# Patient Record
Sex: Male | Born: 1980 | Race: White | Hispanic: No | Marital: Married | Smoking: Former smoker
Health system: Southern US, Community
[De-identification: ages and names within clinical notes are randomized; demographics above are authoritative.]

## PROBLEM LIST (undated history)

## (undated) DIAGNOSIS — R1031 Right lower quadrant pain: Secondary | ICD-10-CM

## (undated) DIAGNOSIS — R630 Anorexia: Secondary | ICD-10-CM

## (undated) DIAGNOSIS — J069 Acute upper respiratory infection, unspecified: Secondary | ICD-10-CM

## (undated) HISTORY — DX: Anorexia: R63.0

## (undated) HISTORY — PX: HERNIA REPAIR: SHX51

## (undated) HISTORY — PX: OTHER SURGICAL HISTORY: SHX169

## (undated) HISTORY — DX: Acute upper respiratory infection, unspecified: J06.9

## (undated) HISTORY — PX: APPENDECTOMY: SHX54

## (undated) HISTORY — DX: Right lower quadrant pain: R10.31

---

## 2010-09-21 ENCOUNTER — Encounter: Payer: Self-pay | Admitting: Physician Assistant

## 2013-03-07 ENCOUNTER — Encounter: Payer: Self-pay | Admitting: Family Medicine

## 2013-03-07 ENCOUNTER — Encounter (INDEPENDENT_AMBULATORY_CARE_PROVIDER_SITE_OTHER): Payer: Self-pay

## 2013-03-07 ENCOUNTER — Ambulatory Visit (INDEPENDENT_AMBULATORY_CARE_PROVIDER_SITE_OTHER): Payer: Managed Care, Other (non HMO) | Admitting: Family Medicine

## 2013-03-07 VITALS — BP 129/86 | HR 56 | Temp 97.8°F | Ht 71.0 in | Wt 173.2 lb

## 2013-03-07 DIAGNOSIS — J020 Streptococcal pharyngitis: Secondary | ICD-10-CM

## 2013-03-07 LAB — POCT RAPID STREP A (OFFICE): Rapid Strep A Screen: NEGATIVE

## 2013-03-07 MED ORDER — AMOXICILLIN 875 MG PO TABS: 875.0000 mg | ORAL_TABLET | Freq: Two times a day (BID) | ORAL | Status: DC

## 2013-03-07 NOTE — Patient Instructions (Signed)

## 2013-03-07 NOTE — Progress Notes (Signed)
  Subjective:    Patient ID: Andre Long, male    DOB: 05-08-1980, 32 y.o.   MRN: [id number]  HPI  This 32 y.o. male presents for evaluation of URI sx's for over a week.  Review of Systems    No chest pain, SOB, HA, dizziness, vision change, N/V, diarrhea, constipation, dysuria, urinary urgency or frequency, myalgias, arthralgias or rash.  Objective:   Physical Exam  Vital signs noted  Well developed well nourished male.  HEENT - Head atraumatic Normocephalic                Eyes - PERRLA, Conjuctiva - clear Sclera- Clear EOMI                Ears - EAC's Wnl TM's Wnl Gross Hearing WNL                Nose - Nares patent                 Throat - oropharanx wnl Respiratory - Lungs CTA bilateral Cardiac - RRR S1 and S2 without murmur GI - Abdomen soft Nontender and bowel sounds active x 4 Extremities - No edema. Neuro - Grossly intact.  Results for orders placed in visit on 03/07/13  POCT RAPID STREP A (OFFICE)      Result Value Range   Rapid Strep A Screen Negative  Negative      Assessment & Plan:  Streptococcal sore throat - Plan: POCT rapid strep A, amoxicillin (AMOXIL) 875 MG tablet  Deatra Canter FNP

## 2013-07-25 ENCOUNTER — Ambulatory Visit (INDEPENDENT_AMBULATORY_CARE_PROVIDER_SITE_OTHER): Payer: Managed Care, Other (non HMO) | Admitting: Family Medicine

## 2013-07-25 ENCOUNTER — Encounter: Payer: Self-pay | Admitting: Family Medicine

## 2013-07-25 VITALS — BP 132/87 | HR 66 | Temp 98.3°F | Ht 71.0 in | Wt 175.0 lb

## 2013-07-25 DIAGNOSIS — R071 Chest pain on breathing: Secondary | ICD-10-CM

## 2013-07-25 DIAGNOSIS — R0789 Other chest pain: Secondary | ICD-10-CM

## 2013-07-25 MED ORDER — NAPROXEN 500 MG PO TABS: 500.0000 mg | ORAL_TABLET | Freq: Two times a day (BID) | ORAL | Status: DC

## 2013-07-25 MED ORDER — CYCLOBENZAPRINE HCL 10 MG PO TABS: 10.0000 mg | ORAL_TABLET | Freq: Three times a day (TID) | ORAL | Status: DC | PRN

## 2013-07-25 NOTE — Progress Notes (Signed)
   Subjective:    Patient ID: Francia GreavesJamie Hofman, male    DOB: 20-Nov-1980, 33 y.o.   MRN: [id number]  HPI This 33 y.o. male presents for evaluation of right shoulder and chest wall discomfort after cutting Down some brush a few days ago.   Review of Systems No chest pain, SOB, HA, dizziness, vision change, N/V, diarrhea, constipation, dysuria, urinary urgency or frequency, myalgias, arthralgias or rash.     Objective:   Physical Exam  Vital signs noted  Well developed well nourished male.  HEENT - Head atraumatic Normocephalic                Eyes - PERRLA, Conjuctiva - clear Sclera- Clear EOMI Respiratory - Lungs CTA bilateral Cardiac - RRR S1 and S2 without murmur MS - TTP right lateral intercostal and below pectoralis muscle.  No paradoxical movement or          Crepitus.      Assessment & Plan:  Chest wall pain - Plan: cyclobenzaprine (FLEXERIL) 10 MG tablet, naproxen (NAPROSYN) 500 MG tablet Note for work today and follow up prn  Deatra CanterWilliam J Oxford FNP

## 2013-10-07 ENCOUNTER — Ambulatory Visit (INDEPENDENT_AMBULATORY_CARE_PROVIDER_SITE_OTHER): Payer: Managed Care, Other (non HMO) | Admitting: Family Medicine

## 2013-10-07 ENCOUNTER — Ambulatory Visit (INDEPENDENT_AMBULATORY_CARE_PROVIDER_SITE_OTHER): Payer: Managed Care, Other (non HMO)

## 2013-10-07 ENCOUNTER — Encounter: Payer: Self-pay | Admitting: Family Medicine

## 2013-10-07 VITALS — BP 134/86 | HR 72 | Temp 98.8°F | Ht 71.0 in | Wt 175.4 lb

## 2013-10-07 DIAGNOSIS — M654 Radial styloid tenosynovitis [de Quervain]: Secondary | ICD-10-CM

## 2013-10-07 DIAGNOSIS — M79609 Pain in unspecified limb: Secondary | ICD-10-CM

## 2013-10-07 DIAGNOSIS — R071 Chest pain on breathing: Secondary | ICD-10-CM

## 2013-10-07 DIAGNOSIS — R0789 Other chest pain: Secondary | ICD-10-CM

## 2013-10-07 MED ORDER — HYDROCODONE-ACETAMINOPHEN 5-325 MG PO TABS: 1.0000 | ORAL_TABLET | Freq: Four times a day (QID) | ORAL | Status: DC | PRN

## 2013-10-07 MED ORDER — NAPROXEN 500 MG PO TABS: 500.0000 mg | ORAL_TABLET | Freq: Two times a day (BID) | ORAL | Status: DC

## 2013-10-07 NOTE — Progress Notes (Signed)
   Subjective:    Patient ID: Andre Long, male    DOB: Feb 01, 1981, 33 y.o.   MRN: [id number]  HPI This 33 y.o. male presents for evaluation of left thumb and wrist pain for several days. He denies any injury.  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  He has been Having a lot of pain and wrist discomfort on the left thumb and wrist.   Review of Systems C/o left thumb and wrist pain No chest pain, SOB, HA, dizziness, vision change, N/V, diarrhea, constipation, dysuria, urinary urgency or frequency, myalgias, arthralgias or rash.     Objective:   Physical Exam Vital signs noted  Well developed well nourished male.  HEENT - Head atraumatic Normocephalic Respiratory - Lungs CTA bilateral Cardiac - RRR S1 and S2 without murmur GI - Abdomen soft Nontender and bowel sounds active x 4 MS - Positive left finklestein's   Xray left wrist and thumb - negative for fracture      Assessment & Plan:   Tendinitis, de Quervain's - Plan: naproxen (NAPROSYN) 500 MG tablet, HYDROcodone-acetaminophen (NORCO) 5-325 MG per tablet, DG Finger Thumb Left, DG Wrist Complete Left  Spica splint left wrist  Deatra CanterWilliam J Oxford FNP

## 2013-11-07 ENCOUNTER — Encounter: Payer: Self-pay | Admitting: Family Medicine

## 2013-11-07 ENCOUNTER — Ambulatory Visit (INDEPENDENT_AMBULATORY_CARE_PROVIDER_SITE_OTHER): Payer: Managed Care, Other (non HMO) | Admitting: Family Medicine

## 2013-11-07 VITALS — BP 120/81 | HR 65 | Temp 98.1°F | Ht 71.0 in | Wt 173.2 lb

## 2013-11-07 DIAGNOSIS — M654 Radial styloid tenosynovitis [de Quervain]: Secondary | ICD-10-CM

## 2013-11-07 MED ORDER — HYDROCODONE-ACETAMINOPHEN 5-325 MG PO TABS: 1.0000 | ORAL_TABLET | Freq: Four times a day (QID) | ORAL | Status: DC | PRN

## 2013-11-07 MED ORDER — NAPROXEN 500 MG PO TABS: 500.0000 mg | ORAL_TABLET | Freq: Two times a day (BID) | ORAL | Status: DC

## 2013-11-07 NOTE — Progress Notes (Signed)
   Subjective:    Patient ID: Andre Long, male    DOB: Jun 05, 1980, 33 y.o.   MRN: [id number]  HPI  This 33 y.o. male presents for evaluation of right wrist and thumb discomfort.  He has been wearing a spica splint but is not getting better.  He takes naprosyn and hydrocodone and is out of meds.  Review of Systems C/o right wrist pain. No chest pain, SOB, HA, dizziness, vision change, N/V, diarrhea, constipation, dysuria, urinary urgency or frequency, myalgias, arthralgias or rash.     Objective:   Physical Exam Vital signs noted  Well developed well nourished male.  HEENT - Head atraumatic Normocephalic                Eyes - PERRLA, Conjuctiva - clear Sclera- Clear EOMI                Ears - EAC's Wnl TM's Wnl Gross Hearing WNL                Throat - oropharanx wnl Respiratory - Lungs CTA bilateral Cardiac - RRR S1 and S2 without murmur MS - TTP right wrist and positive finklestein and crepitus right thumb       Assessment & Plan:  Tendinitis, de Quervain's - Plan: HYDROcodone-acetaminophen (NORCO) 5-325 MG per tablet, naproxen (NAPROSYN) 500 MG tablet, Ambulatory referral to Orthopedic Surgery  Refer to ortho since not better.  Deatra CanterWilliam J Oxford FNP

## 2014-12-24 ENCOUNTER — Ambulatory Visit (INDEPENDENT_AMBULATORY_CARE_PROVIDER_SITE_OTHER): Payer: Managed Care, Other (non HMO) | Admitting: Family Medicine

## 2014-12-24 ENCOUNTER — Encounter: Payer: Self-pay | Admitting: Family Medicine

## 2014-12-24 VITALS — BP 126/87 | HR 93 | Temp 98.1°F | Ht 71.0 in | Wt 184.0 lb

## 2014-12-24 DIAGNOSIS — G5602 Carpal tunnel syndrome, left upper limb: Secondary | ICD-10-CM | POA: Diagnosis not present

## 2014-12-24 DIAGNOSIS — G5600 Carpal tunnel syndrome, unspecified upper limb: Secondary | ICD-10-CM | POA: Insufficient documentation

## 2014-12-24 DIAGNOSIS — G5603 Carpal tunnel syndrome, bilateral upper limbs: Secondary | ICD-10-CM

## 2014-12-24 DIAGNOSIS — G5601 Carpal tunnel syndrome, right upper limb: Secondary | ICD-10-CM | POA: Diagnosis not present

## 2014-12-24 MED ORDER — MELOXICAM 7.5 MG PO TABS: 7.5000 mg | ORAL_TABLET | Freq: Every day | ORAL | Status: DC

## 2014-12-24 NOTE — Progress Notes (Signed)
BP 126/87 mmHg  Pulse 93  Temp(Src) 98.1 F (36.7 C) (Oral)  Ht  (1.803 m)  Wt 184 lb (83.462 kg)  BMI 25.67 kg/m2   Subjective:    Patient ID: Andre Long, male    DOB: 12-Dec-1980, 34 y.o.   MRN: [id number]  HPI: Andre Long is a 34 y.o. male presenting on 12/24/2014 for Numbness and Hand Pain   HPI Hand pain Patient has bilateral hand pain that is also described as a sharp tingling feeling likely new leg falls asleep. It is been happening more frequently over the past few months. The pain is more on the medial side of his hand and fingers and his thumb. It is worse on his right hand than his left because he is right-handed. Patient denies any fevers or chills or warmth or redness in the area. 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 He has a wrist brace for the right hand and will obtain one for the left hand as well.  Relevant past medical, surgical, family and social history reviewed and updated as indicated. Interim medical history since our last visit reviewed. Allergies and medications reviewed and updated.  Review of Systems  Constitutional: Negative for fever and chills.  HENT: Negative for ear discharge and ear pain.   Eyes: Negative for discharge and visual disturbance.  Respiratory: Negative for shortness of breath and wheezing.   Cardiovascular: Negative for chest pain and leg swelling.  Gastrointestinal: Negative for abdominal pain, diarrhea and constipation.  Genitourinary: Negative for difficulty urinating.  Musculoskeletal: Negative for back pain, joint swelling, arthralgias, gait problem, neck pain and neck stiffness.  Skin: Negative for rash.  Neurological: Positive for numbness (and tingling). Negative for syncope, light-headedness and headaches.  All other systems reviewed and are negative.   Per HPI unless specifically indicated above     Medication List       This list is accurate as of: 12/24/14  4:16 PM.  Always  use your most recent med list.               loratadine 10 MG tablet  Commonly known as:  CLARITIN  Take 10 mg by mouth daily.     meloxicam 7.5 MG tablet  Commonly known as:  MOBIC  Take 1 tablet (7.5 mg total) by mouth daily.           Objective:    BP 126/87 mmHg  Pulse 93  Temp(Src) 98.1 F (36.7 C) (Oral)  Ht  (1.803 m)  Wt 184 lb (83.462 kg)  BMI 25.67 kg/m2  Wt Readings from Last 3 Encounters:  12/24/14 184 lb (83.462 kg)  11/07/13 173 lb 3.2 oz (78.563 kg)  10/07/13 175 lb 6.4 oz (79.561 kg)    Physical Exam  Constitutional: He is oriented to person, place, and time. He appears well-developed and well-nourished. No distress.  Eyes: Conjunctivae and EOM are normal. Pupils are equal, round, and reactive to light. Right eye exhibits no discharge. No scleral icterus.  Cardiovascular: Normal rate, regular rhythm, normal heart sounds and intact distal pulses.   No murmur heard. Pulmonary/Chest: Effort normal and breath sounds normal. No respiratory distress. He has no wheezes.  Abdominal: He exhibits no distension.  Musculoskeletal: Normal range of motion. He exhibits no edema.       Right wrist: He exhibits tenderness (positive Tinel). He exhibits normal range of motion, no bony tenderness, no swelling, no effusion, no crepitus, no  deformity and no laceration.       Left wrist: He exhibits normal range of motion, no tenderness, no bony tenderness, no swelling, no effusion, no crepitus, no deformity and no laceration.       Arms: Neurological: He is alert and oriented to person, place, and time. Coordination normal.  Skin: Skin is warm and dry. No rash noted. He is not diaphoretic.  Psychiatric: He has a normal mood and affect. His behavior is normal.  Vitals reviewed.   Results for orders placed or performed in visit on 03/07/13  POCT rapid strep A  Result Value Ref Range   Rapid Strep A Screen Negative Negative      Assessment & Plan:   Problem List  Items Addressed This Visit      Nervous and Auditory   Carpal tunnel syndrome - Primary    Bilateral nerve pain, worse right than left, will try anti-inflammatory. If not improved with anti-inflammatory will try injections of carpal tunnel region with steroids. If not improved with that then we'll do referral to surgeon      Relevant Medications   meloxicam (MOBIC) 7.5 MG tablet       Follow up plan: Return if symptoms worsen or fail to improve.  Arville Care, MD [facility] 12/24/2014, 4:16 PM

## 2014-12-24 NOTE — Patient Instructions (Signed)
Carpal Tunnel Syndrome The carpal tunnel is a narrow area located on the palm side of your wrist. The tunnel is formed by the wrist bones and ligaments. Nerves, blood vessels, and tendons pass through the carpal tunnel. Repeated wrist motion or certain diseases may cause swelling within the tunnel. This swelling pinches the main nerve in the wrist (median nerve) and causes the painful hand and arm condition called carpal tunnel syndrome. CAUSES   Repeated wrist motions.  Wrist injuries.  Certain diseases like arthritis, diabetes, alcoholism, hyperthyroidism, and kidney failure.  Obesity.  Pregnancy. SYMPTOMS   A "pins and needles" feeling in your fingers or hand, especially in your thumb, index and middle fingers.  Tingling or numbness in your fingers or hand.  An aching feeling in your entire arm, especially when your wrist and elbow are bent for long periods of time.  Wrist pain that goes up your arm to your shoulder.  Pain that goes down into your palm or fingers.  A weak feeling in your hands. DIAGNOSIS  Your health care provider will take your history and perform a physical exam. An electromyography test may be needed. This test measures electrical signals sent out by your nerves into the muscles. The electrical signals are usually slowed by carpal tunnel syndrome. You may also need X-rays. TREATMENT  Carpal tunnel syndrome may clear up by itself. Your health care provider may recommend a wrist splint or medicine such as a nonsteroidal anti-inflammatory medicine. Cortisone injections may help. Sometimes, surgery may be needed to free the pinched nerve.  HOME CARE INSTRUCTIONS   Take all medicine as directed by your health care provider. Only take over-the-counter or prescription medicines for pain, discomfort, or fever as directed by your health care provider.  If you were given a splint to keep your wrist from bending, wear it as directed. It is important to wear the splint at  night. Wear the splint for as long as you have pain or numbness in your hand, arm, or wrist. This may take 1 to 2 months.  Rest your wrist from any activity that may be causing your pain. If your symptoms are work-related, you may need to talk to your employer about changing to a job that does not require using your wrist.  Put ice on your wrist after long periods of wrist activity.  Put ice in a plastic bag.  Place a towel between your skin and the bag.  Leave the ice on for 15-20 minutes, 03-04 times a day.  Keep all follow-up visits as directed by your health care provider. This includes any orthopedic referrals, physical therapy, and rehabilitation. Any delay in getting necessary care could result in a delay or failure of your condition to heal. SEEK IMMEDIATE MEDICAL CARE IF:   You have new, unexplained symptoms.  Your symptoms get worse and are not helped or controlled with medicines. MAKE SURE YOU:   Understand these instructions.  Will watch your condition.  Will get help right away if you are not doing well or get worse. Document Released: 04/01/2000 Document Revised: 08/19/2013 Document Reviewed: 02/18/2011 Exit[employer] Patient Information 2015 [employer]. This information is not intended to replace advice given to you by your health care provider. Make sure you discuss any questions you have with your health care provider.  

## 2014-12-24 NOTE — Assessment & Plan Note (Addendum)
Bilateral nerve pain, worse right than left, will try anti-inflammatory. If not improved with anti-inflammatory will try injections of carpal tunnel region with steroids. If not improved with that then we'll do referral to surgeon

## 2015-01-20 ENCOUNTER — Other Ambulatory Visit: Payer: Self-pay | Admitting: Family Medicine

## 2015-03-04 ENCOUNTER — Other Ambulatory Visit: Payer: Self-pay | Admitting: Family Medicine

## 2015-06-16 ENCOUNTER — Ambulatory Visit (INDEPENDENT_AMBULATORY_CARE_PROVIDER_SITE_OTHER): Payer: Managed Care, Other (non HMO) | Admitting: Family Medicine

## 2015-06-16 ENCOUNTER — Encounter: Payer: Self-pay | Admitting: *Deleted

## 2015-06-16 ENCOUNTER — Ambulatory Visit (INDEPENDENT_AMBULATORY_CARE_PROVIDER_SITE_OTHER): Payer: Managed Care, Other (non HMO)

## 2015-06-16 ENCOUNTER — Encounter: Payer: Self-pay | Admitting: Family Medicine

## 2015-06-16 VITALS — BP 131/88 | HR 76 | Temp 97.4°F | Ht 71.0 in | Wt 188.0 lb

## 2015-06-16 DIAGNOSIS — M79645 Pain in left finger(s): Secondary | ICD-10-CM

## 2015-06-16 MED ORDER — MELOXICAM 15 MG PO TABS: 15.0000 mg | ORAL_TABLET | Freq: Every day | ORAL | Status: DC

## 2015-06-16 NOTE — Patient Instructions (Addendum)
Use splint as much as possible during the day and even at night if possible Take anti-inflammatory medicine regularly after eating unless it bothers his stomach then discontinue it Please call us in 2 weeks and we will schedule a visit with orthopedic surgeon for follow-up if you're no better by that time.

## 2015-06-16 NOTE — Progress Notes (Signed)
Subjective:    Patient ID: Andre Long, male    DOB: Oct 30, 1980, 35 y.o.   MRN: [id number]  HPI Patient here today for left thumb pain and immobilization. This first was noticed about 3 months ago. The patient has a history of having had tendon surgery on the left wrist about one year ago. Over the past 3 months he is developing more problems with his thumb and being able to use the DIP joint of the left thumb. It is painful with use but not painful to touch. His work requires the use of both hands. He is not able to use this as he would like to at work.      Patient Active Problem List   Diagnosis Date Noted  . Carpal tunnel syndrome 12/24/2014   Outpatient Encounter Prescriptions as of 06/16/2015  Medication Sig  . loratadine (CLARITIN) 10 MG tablet Take 10 mg by mouth daily.  . [DISCONTINUED] meloxicam (MOBIC) 7.5 MG tablet TAKE 1 TABLET (7.5 MG TOTAL) BY MOUTH DAILY.   No facility-administered encounter medications on file as of 06/16/2015.     Review of Systems  Constitutional: Negative.   HENT: Negative.   Eyes: Negative.   Respiratory: Negative.   Cardiovascular: Negative.   Gastrointestinal: Negative.   Endocrine: Negative.   Genitourinary: Negative.   Musculoskeletal: Positive for arthralgias (left thumb).  Skin: Negative.   Allergic/Immunologic: Negative.   Neurological: Negative.   Hematological: Negative.   Psychiatric/Behavioral: Negative.        Objective:   Physical Exam  Constitutional: He is oriented to person, place, and time. He appears well-developed and well-nourished. No distress.  Musculoskeletal: He exhibits no edema or tenderness.  Limited range of motion of DIP joint of the left thumb. No swelling no tenderness to palpation.  Neurological: He is alert and oriented to person, place, and time.  Skin: Skin is warm and dry. No rash noted.  Psychiatric: He has a normal mood and affect. His behavior is normal. Judgment and thought content normal.    Nursing note and vitals reviewed.  BP 131/88 mmHg  Pulse 76  Temp(Src) 97.4 F (36.3 C) (Oral)  Ht  (1.803 m)  Wt 188 lb (85.276 kg)  BMI 26.23 kg/m2  [facility] reading (PRIMARY) by  Dr.Zinedine Ellner-left thumb                                  We will fix a splint for him to use on the thumb to wear during the day at work and at night when sleeping.      Assessment & Plan:  1. Thumb pain, left -Wear splint as directed during the day at work and at night while sleeping -Call in 2 weeks and if not better we will arrange for you to see the orthopedic surgeon again, Dr. Orlan Leavens. - meloxicam (MOBIC) 15 MG tablet; Take 1 tablet (15 mg total) by mouth daily.  Dispense: 30 tablet; Refill: 0 - DG Finger Thumb Left; Future  Meds ordered this encounter  Medications  . meloxicam (MOBIC) 15 MG tablet    Sig: Take 1 tablet (15 mg total) by mouth daily.    Dispense:  30 tablet    Refill:  0   Patient Instructions  Use splint as much as possible during the day and even at night if possible Take anti-inflammatory medicine regularly after eating unleDmarcus Deciccothers his stomach then discontinue it Please  call us in 2 weeks and we will schedule a visit with orthopedic surgeon for follow-up if you're no better by that time.   Nyra Capes MD

## 2015-06-30 ENCOUNTER — Encounter: Payer: Self-pay | Admitting: *Deleted

## 2015-06-30 ENCOUNTER — Ambulatory Visit: Payer: Managed Care, Other (non HMO) | Admitting: Family Medicine

## 2015-06-30 NOTE — Progress Notes (Signed)
Pt came by today to let [facility] know how his thumb pain was. He states that the mobic helps some, but they pain is still there. [facility] wanted to go ahead and send pt to Hand specialist. [facility] called and got the pt an appt on Tuesday with Dr Orlan Leavensrtman. He is to continue Mobic and follow up with this appt.

## 2015-07-15 ENCOUNTER — Other Ambulatory Visit: Payer: Self-pay | Admitting: Family Medicine

## 2015-09-07 ENCOUNTER — Encounter: Payer: Self-pay | Admitting: Family

## 2015-09-07 ENCOUNTER — Ambulatory Visit (INDEPENDENT_AMBULATORY_CARE_PROVIDER_SITE_OTHER): Payer: Managed Care, Other (non HMO) | Admitting: Family

## 2015-09-07 ENCOUNTER — Encounter (INDEPENDENT_AMBULATORY_CARE_PROVIDER_SITE_OTHER): Payer: Self-pay

## 2015-09-07 VITALS — BP 132/90 | HR 105 | Temp 97.9°F | Ht 71.0 in | Wt 184.6 lb

## 2015-09-07 DIAGNOSIS — J069 Acute upper respiratory infection, unspecified: Secondary | ICD-10-CM | POA: Diagnosis not present

## 2015-09-07 MED ORDER — FLUTICASONE PROPIONATE 50 MCG/ACT NA SUSP: 2.0000 | Freq: Every day | NASAL | Status: DC

## 2015-09-07 MED ORDER — AZITHROMYCIN 250 MG PO TABS: ORAL_TABLET | ORAL | Status: DC

## 2015-09-07 NOTE — Progress Notes (Signed)
Subjective:    Patient ID: Andre Long, male    DOB: [birth year], 35 y.o.   MRN: [id number]  Cough This is a new problem. The current episode started in the past 7 days. The problem has been gradually worsening. The problem occurs every few minutes. The cough is non-productive. Associated symptoms include chills, headaches, myalgias, nasal congestion, postnasal drip, rhinorrhea and a sore throat. Pertinent negatives include no ear congestion, ear pain, fever, shortness of breath or wheezing. The symptoms are aggravated by lying down. He has tried rest (mucinex) for the symptoms. The treatment provided mild relief. There is no history of asthma or COPD.  Sinus Problem Associated symptoms include chills, coughing, headaches and a sore throat. Pertinent negatives include no ear pain or shortness of breath.      Review of Systems  Constitutional: Positive for chills. Negative for fever.  HENT: Positive for postnasal drip, rhinorrhea and sore throat. Negative for ear pain.   Respiratory: Positive for cough. Negative for shortness of breath and wheezing.   Musculoskeletal: Positive for myalgias.  Neurological: Positive for headaches.  All other systems reviewed and are negative.      Objective:   Physical Exam  Constitutional: He is oriented to person, place, and time. He appears well-developed and well-nourished. No distress.  HENT:  Head: Normocephalic.  Right Ear: External ear normal. Tympanic membrane is erythematous.  Left Ear: External ear normal. Tympanic membrane is erythematous.  Nasal passage erythemas with mild swelling  Oropharynx erythemas   Eyes: Pupils are equal, round, and reactive to light. Right eye exhibits no discharge. Left eye exhibits no discharge.  Neck: Normal range of motion. Neck supple. No thyromegaly present.  Cardiovascular: Normal rate, regular rhythm, normal heart sounds and intact distal pulses.   No murmur heard. Pulmonary/Chest: Effort normal and  breath sounds normal. No respiratory distress. He has no wheezes.  Abdominal: Soft. Bowel sounds are normal. He exhibits no distension. There is no tenderness.  Musculoskeletal: Normal range of motion. He exhibits no edema or tenderness.  Neurological: He is alert and oriented to person, place, and time. He has normal reflexes. No cranial nerve deficit.  Skin: Skin is warm and dry. No rash noted. No erythema.  Psychiatric: He has a normal mood and affect. His behavior is normal. Judgment and thought content normal.  Vitals reviewed.     BP 132/90 mmHg  Pulse 105  Temp(Src) 97.9 F (36.6 C) (Oral)  Ht  (1.803 m)  Wt 184 lb 9.6 oz (83.734 kg)  BMI 25.76 kg/m2     Assessment & Plan:  1. Acute upper respiratory infection -- Take meds as prescribed - Use a cool mist humidifier  -Use saline nose sprays frequently -Saline irrigations of the nose can be very helpful if done frequently.  * 4X daily for 1 week*  * Use of a nettie pot can be helpful with this. Follow directions with this* -Force fluids -For any cough or congestion  Use plain Mucinex- regular strength or max strength is fine   * Children- consult with Pharmacist for dosing -For fever or aces or pains- take tylenol or ibuprofen appropriate for age and weight.  * for fevers greater than 101 orally you may alternate ibuprofen and tylenol every  3 hours. -Throat lozenges if help - azithromycin (ZITHROMAX Z-PAK) 250 MG tablet; As directed  Dispense: 1 each; Refill: 0 - fluticasone (FLONASE) 50 MCG/ACT nasal spray; Place 2 sprays into both nostrils daily.  Dispense: 16 g; Refill:  6  Jannifer Rodneyhristy Ashby Moskal, [place]

## 2015-09-07 NOTE — Patient Instructions (Addendum)
Upper Respiratory Infection, Adult Most upper respiratory infections (URIs) are a viral infection of the air passages leading to the lungs. A URI affects the nose, throat, and upper air passages. The most common type of URI is nasopharyngitis and is typically referred to as "the common cold." URIs run their course and usually go away on their own. Most of the time, a URI does not require medical attention, but sometimes a bacterial infection in the upper airways can follow a viral infection. This is called a secondary infection. Sinus and middle ear infections are common types of secondary upper respiratory infections. Bacterial pneumonia can also complicate a URI. A URI can worsen asthma and chronic obstructive pulmonary disease (COPD). Sometimes, these complications can require emergency medical care and may be life threatening.  CAUSES Almost all URIs are caused by viruses. A virus is a type of germ and can spread from one person to another.  RISKS FACTORS You may be at risk for a URI if:   You smoke.   You have chronic heart or lung disease.  You have a weakened defense (immune) system.   You are very young or very old.   You have nasal allergies or asthma.  You work in crowded or poorly ventilated areas.  You work in health care facilities or schools. SIGNS AND SYMPTOMS  Symptoms typically develop 2-3 days after you come in contact with a cold virus. Most viral URIs last 7-10 days. However, viral URIs from the influenza virus (flu virus) can last 14-18 days and are typically more severe. Symptoms may include:   Runny or stuffy (congested) nose.   Sneezing.   Cough.   Sore throat.   Headache.   Fatigue.   Fever.   Loss of appetite.   Pain in your forehead, behind your eyes, and over your cheekbones (sinus pain).  Muscle aches.  DIAGNOSIS  Your health care provider may diagnose a URI by:  Physical exam.  Tests to check that your symptoms are not due to  another condition such as:  Strep throat.  Sinusitis.  Pneumonia.  Asthma. TREATMENT  A URI goes away on its own with time. It cannot be cured with medicines, but medicines may be prescribed or recommended to relieve symptoms. Medicines may help:  Reduce your fever.  Reduce your cough.  Relieve nasal congestion. HOME CARE INSTRUCTIONS   Take medicines only as directed by your health care provider.   Gargle warm saltwater or take cough drops to comfort your throat as directed by your health care provider.  Use a warm mist humidifier or inhale steam from a shower to increase air moisture. This may make it easier to breathe.  Drink enough fluid to keep your urine clear or pale yellow.   Eat soups and other clear broths and maintain good nutrition.   Rest as needed.   Return to work when your temperature has returned to normal or as your health care provider advises. You may need to stay home longer to avoid infecting others. You can also use a face mask and careful hand washing to prevent spread of the virus.  Increase the usage of your inhaler if you have asthma.   Do not use any tobacco products, including cigarettes, chewing tobacco, or electronic cigarettes. If you need help quitting, ask your health care provider. PREVENTION  The best way to protect yourself from getting a cold is to practice good hygiene.   Avoid oral or hand contact with people with cold   symptoms.   Wash your hands often if contact occurs.  There is no clear evidence that vitamin C, vitamin E, echinacea, or exercise reduces the chance of developing a cold. However, it is always recommended to get plenty of rest, exercise, and practice good nutrition.  SEEK MEDICAL CARE IF:   You are getting worse rather than better.   Your symptoms are not controlled by medicine.   You have chills.  You have worsening shortness of breath.  You have brown or red mucus.  You have yellow or brown nasal  discharge.  You have pain in your face, especially when you bend forward.  You have a fever.  You have swollen neck glands.  You have pain while swallowing.  You have white areas in the back of your throat. SEEK IMMEDIATE MEDICAL CARE IF:   You have severe or persistent:  Headache.  Ear pain.  Sinus pain.  Chest pain.  You have chronic lung disease and any of the following:  Wheezing.  Prolonged cough.  Coughing up blood.  A change in your usual mucus.  You have a stiff neck.  You have changes in your:  Vision.  Hearing.  Thinking.  Mood. MAKE SURE YOU:   Understand these instructions.  Will watch your condition.  Will get help right away if you are not doing well or get worse.   This information is not intended to replace advice given to you by your health care provider. Make sure you discuss any questions you have with your health care provider.   Document Released: 09/28/2000 Document Revised: 08/19/2014 Document Reviewed: 07/10/2013 Elsevier Interactive Patient Education 2016 [employer]  - Take meds as prescribed - Use a cool mist humidifier  -Use saline nose sprays frequently -Saline irrigations of the nose can be very helpful if done frequently.  * 4X daily for 1 week*  * Use of a nettie pot can be helpful with this. Follow directions with this* -Force fluids -For any cough or congestion  Use plain Mucinex- regular strength or max strength is fine   * Children- consult with Pharmacist for dosing -For fever or aces or pains- take tylenol or ibuprofen appropriate for age and weight.  * for fevers greater than 101 orally you may alternate ibuprofen and tylenol every  3 hours. -Throat lozenges if help   Yesly Gerety, FNP   

## 2015-09-15 ENCOUNTER — Encounter: Payer: Self-pay | Admitting: Family Medicine

## 2015-09-15 ENCOUNTER — Ambulatory Visit (INDEPENDENT_AMBULATORY_CARE_PROVIDER_SITE_OTHER): Payer: Managed Care, Other (non HMO) | Admitting: Family Medicine

## 2015-09-15 VITALS — BP 131/85 | HR 79 | Temp 98.5°F | Ht 71.0 in | Wt 183.0 lb

## 2015-09-15 DIAGNOSIS — K625 Hemorrhage of anus and rectum: Secondary | ICD-10-CM

## 2015-09-15 MED ORDER — HYDROCORTISONE ACETATE 25 MG RE SUPP: 25.0000 mg | Freq: Two times a day (BID) | RECTAL | Status: DC

## 2015-09-15 NOTE — Progress Notes (Signed)
   Subjective:    Patient ID: Andre Long, male    DOB: Sep 04, 1980, 35 y.o.   MRN: [id number]  HPI Patient here today for bright red blood in stool.This problem has been going on for some time. Last week it got worse after he took Zithromax and developed some diarrhea. Has not had any pain or weight loss. There is no family history of colon cancer although he thinks his father may have had a polyp in the past.     Patient Active Problem List   Diagnosis Date Noted  . Carpal tunnel syndrome 12/24/2014   Outpatient Encounter Prescriptions as of 09/15/2015  Medication Sig  . fluticasone (FLONASE) 50 MCG/ACT nasal spray Place 2 sprays into both nostrils daily.  Marland Kitchen. loratadine (CLARITIN) 10 MG tablet Take 10 mg by mouth daily. OTC  [facility] comparable brand  . [DISCONTINUED] azithromycin (ZITHROMAX Z-PAK) 250 MG tablet As directed   No facility-administered encounter medications on file as of 09/15/2015.      Review of Systems  Constitutional: Negative.   HENT: Negative.   Eyes: Negative.   Respiratory: Negative.   Cardiovascular: Negative.   Gastrointestinal: Positive for blood in stool. Negative for rectal pain.  Endocrine: Negative.   Genitourinary: Negative.   Musculoskeletal: Negative.   Skin: Negative.   Allergic/Immunologic: Negative.   Neurological: Negative.   Hematological: Negative.   Psychiatric/Behavioral: Negative.        Objective:   Physical Exam  Genitourinary:  Rectal exam indicates internal hemorrhoids. There is no blood on the glove.   BP 131/85 mmHg  Pulse 79  Temp(Src) 98.5 F (36.9 C) (Oral)  Ht 5\' 11"  (1.803 m)  Wt 183 lb (83.008 kg)  BMI 25.53 kg/m2        Assessment & Plan:  1. Rectal bleeding I believe this is coming from internal hemorrhoids and have been formed patient. Will try Anusol HC suppositories for one week. If symptoms persist may need further evaluation with colonoscopy. Frederica KusterStephen M Miller MD

## 2015-09-15 NOTE — Addendum Note (Signed)
Addended by: Magdalene RiverBULLINS, Cledis H on: 09/15/2015 01:36 PM   Modules accepted: Orders

## 2016-02-26 IMAGING — CR DG WRIST COMPLETE 3+V*L*
3 series · 3 of 3 positions shown · non-contrast
Comparison: None.

CLINICAL DATA: Left wrist pain

EXAM:
LEFT WRIST - COMPLETE 3+ VIEW

[view not recorded (1 of 3)]
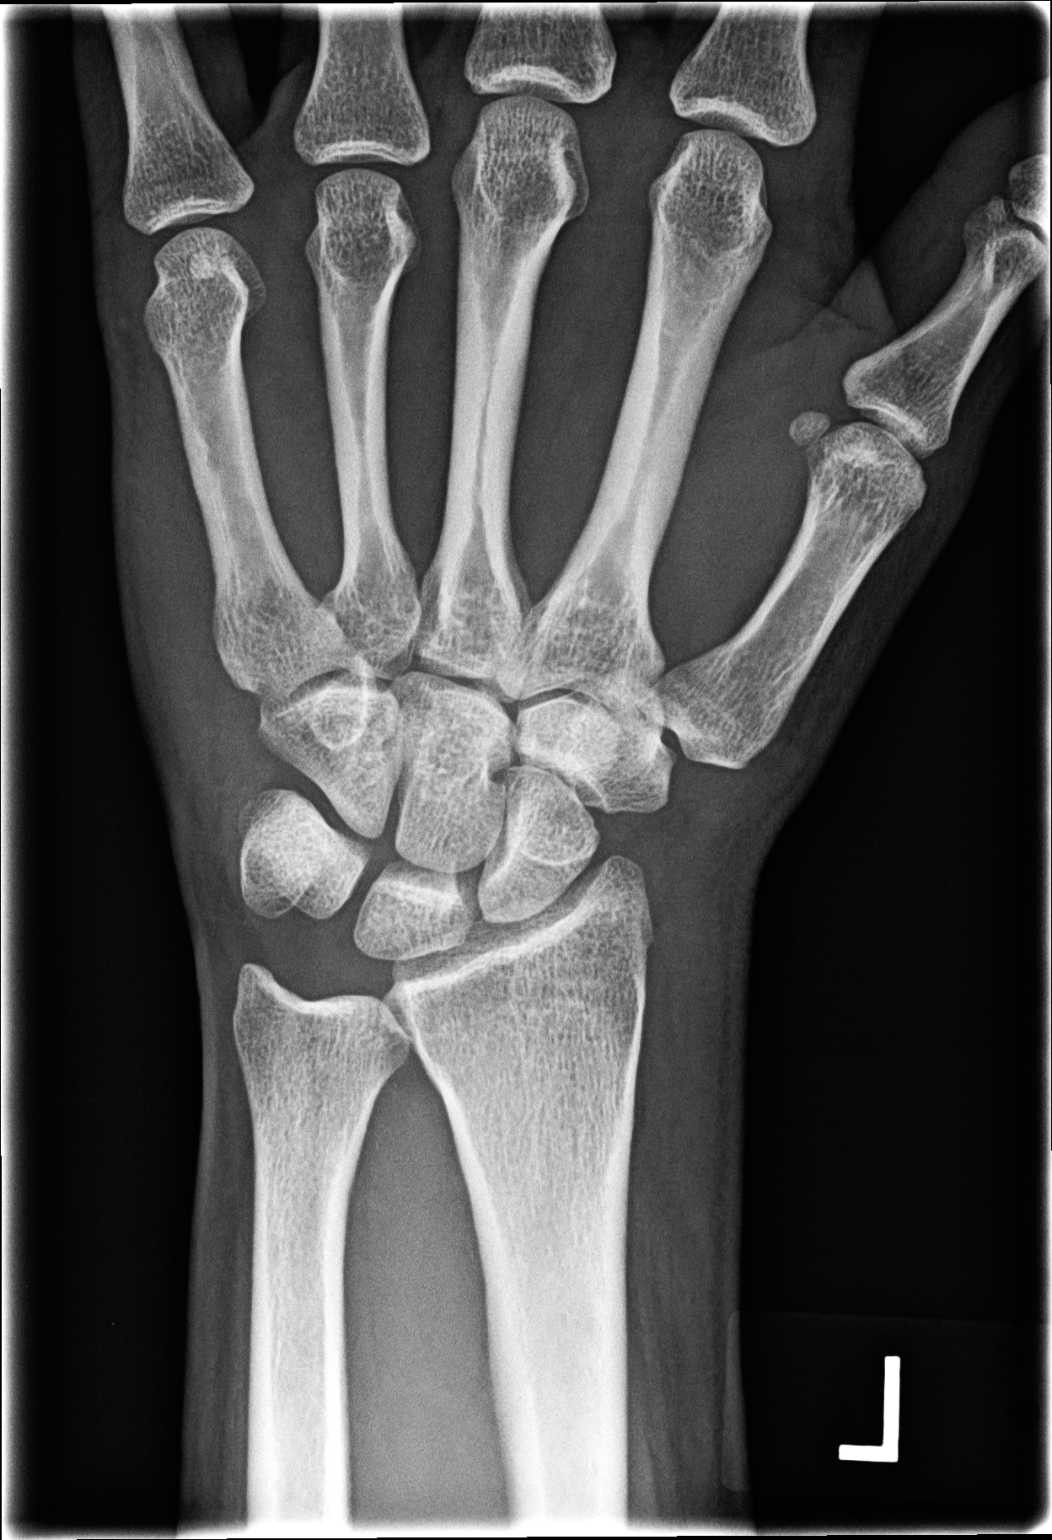

[view not recorded (2 of 3)]
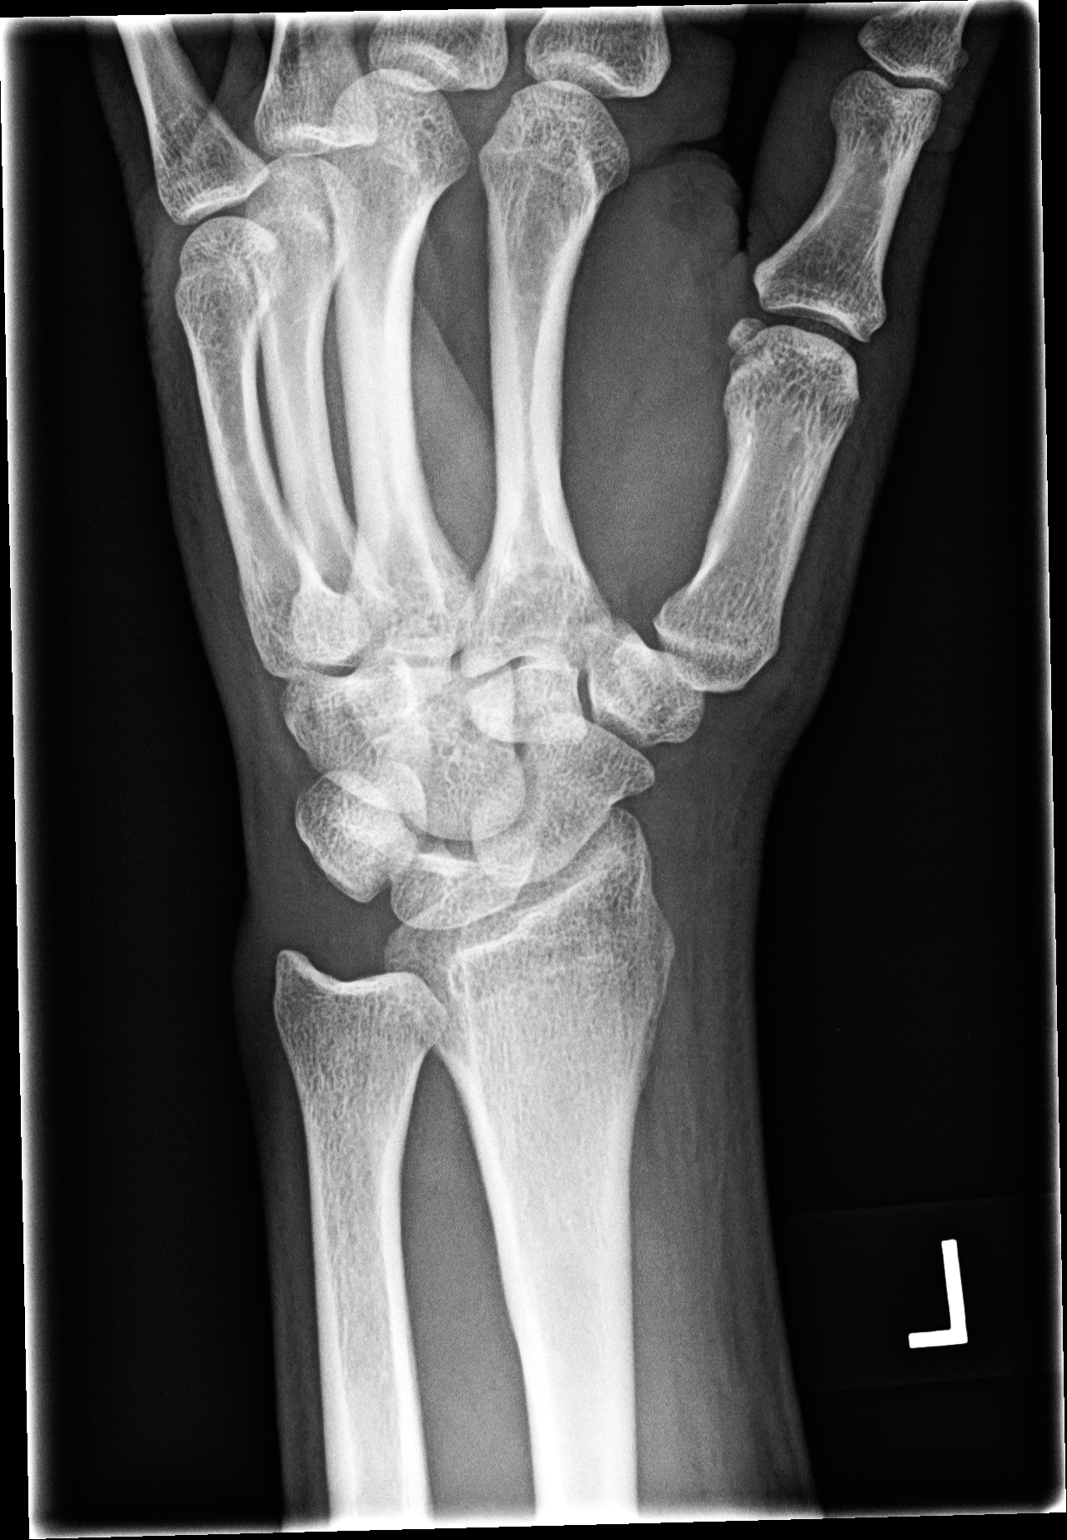

[view not recorded (3 of 3)]
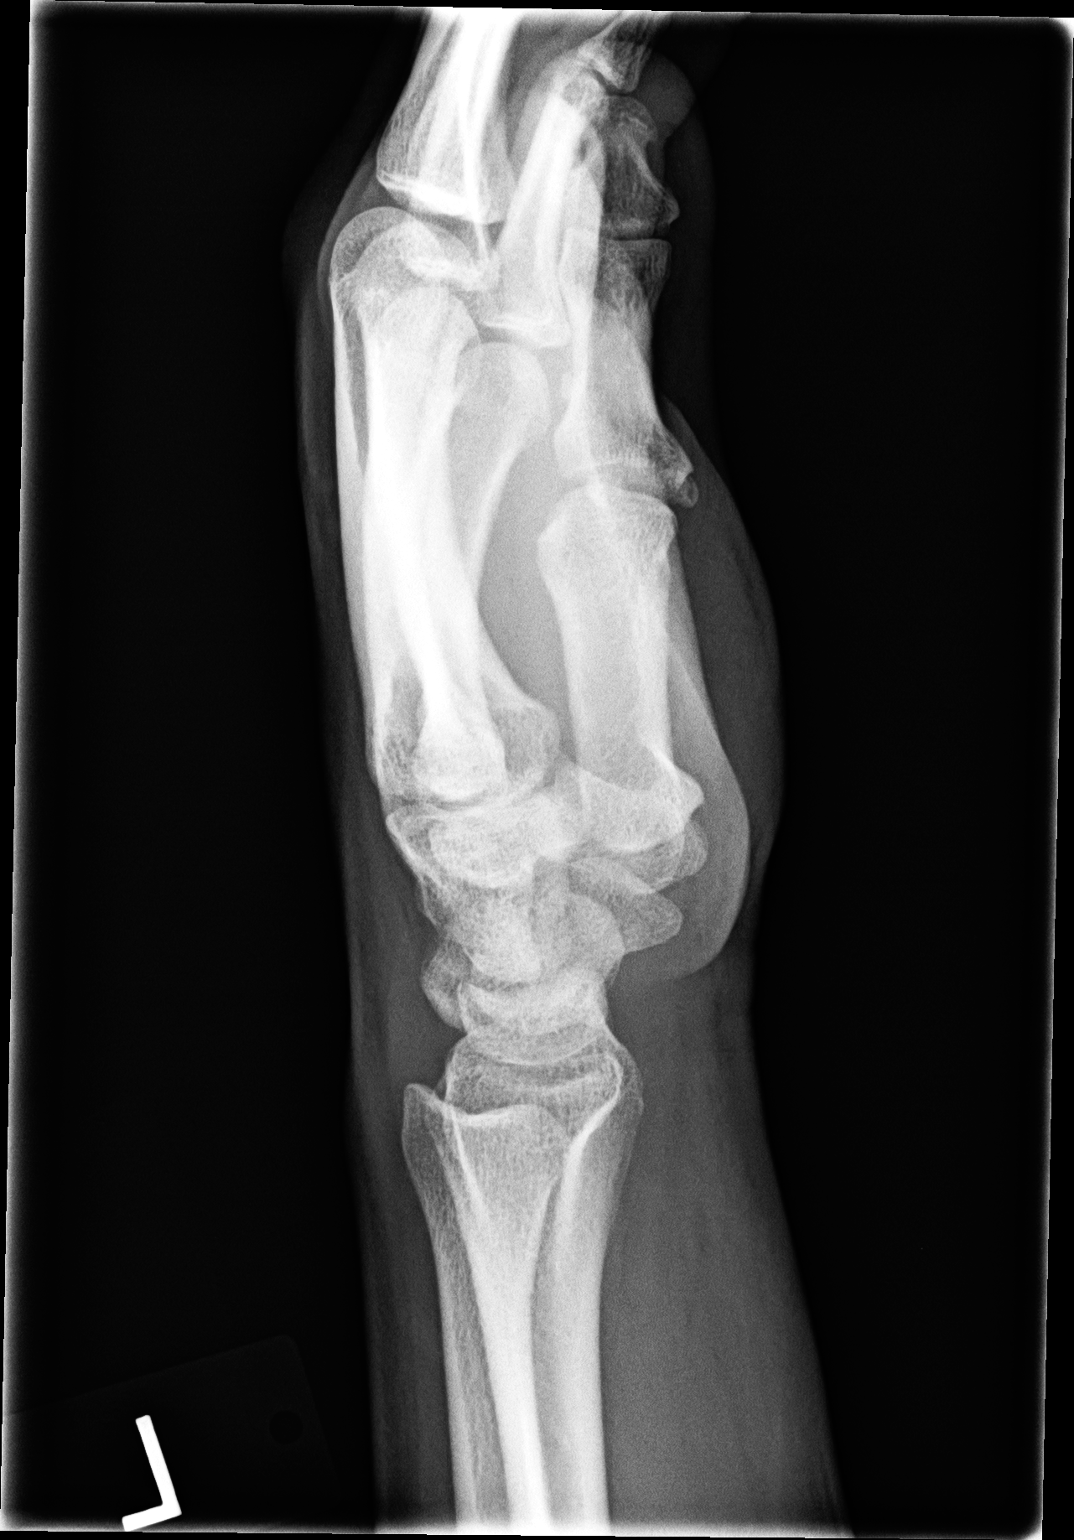

[3 of 3 positions shown; findings below may reference images not displayed]

FINDINGS: The radiocarpal joint space appears normal. The carpal bones are in
normal position. No erosion is seen. No acute abnormality is noted.
IMPRESSION: Negative.

## 2016-06-02 ENCOUNTER — Encounter: Payer: Self-pay | Admitting: Pediatrics

## 2016-06-02 ENCOUNTER — Ambulatory Visit (INDEPENDENT_AMBULATORY_CARE_PROVIDER_SITE_OTHER): Payer: Managed Care, Other (non HMO) | Admitting: Pediatrics

## 2016-06-02 VITALS — BP 145/93 | HR 73 | Temp 98.0°F | Ht 71.0 in | Wt 199.0 lb

## 2016-06-02 DIAGNOSIS — J302 Other seasonal allergic rhinitis: Secondary | ICD-10-CM

## 2016-06-02 DIAGNOSIS — J069 Acute upper respiratory infection, unspecified: Secondary | ICD-10-CM

## 2016-06-02 MED ORDER — AZELASTINE-FLUTICASONE 137-50 MCG/ACT NA SUSP: 2.0000 | Freq: Every day | NASAL | 1 refills | Status: DC

## 2016-06-02 NOTE — Progress Notes (Signed)
  Subjective:   Patient ID: Andre Long, male    DOB: 25-May-1980, 36 y.o.   MRN: [id number] CC: Sinusitis (pt here today c/o scratchy throat, cough, nasal congestion )  HPI: Andre Long is a 36 y.o. male presenting for Sinusitis (pt here today c/o scratchy throat, cough, nasal congestion )  Two nights ago started having scratchy throat, nasal congestion No fevers Good appetite, no vomiting Some upset stomach off and on Doesn't feel sick  Relevant past medical, surgical, family and social history reviewed. Allergies and medications reviewed and updated. History  Smoking Status  . Former Smoker  . Quit date: 03/07/2001  Smokeless Tobacco  . Never Used   ROS: Per HPI   Objective:    BP (!) 145/93   Pulse 73   Temp 98 F (36.7 C) (Oral)   Ht 5\' 11"  (1.803 m)   Wt 199 lb (90.3 kg)   BMI 27.75 kg/m   Wt Readings from Last 3 Encounters:  06/02/16 199 lb (90.3 kg)  09/15/15 183 lb (83 kg)  09/07/15 184 lb 9.6 oz (83.7 kg)    Gen: NAD, alert, cooperative with exam, NCAT EYES: EOMI, no conjunctival injection, or no icterus ENT:  TMs slightly red, nl LR, OP without erythema LYMPH: small < 1cm L ant cervical LAD CV: NRRR, normal S1/S2, no murmur, distal pulses 2+ b/l Resp: CTABL, no wheezes, normal WOB Ext: No edema, warm Neuro: Alert and oriented  Assessment & Plan:  Andre Long was seen today for sinus congestion for past 2 days. H/o allergies, bother her most over the spring to summer.  Diagnoses and all orders for this visit:  Seasonal allergic rhinitis, unspecified chronicity, unspecified trigger -     Azelastine-Fluticasone 137-50 MCG/ACT SUSP; Place 2 sprays into the nose daily.  Acute upper respiratory infection Discussed symptomatic care, return precautions No indications for antibiotics  Follow up plan: No Follow-up on file. Rex Krasarol Kristl Morioka, MD [facility]

## 2016-06-02 NOTE — Patient Instructions (Signed)
Netipot with distilled water 2-3 times a day to clear out sinuses Or Normal saline nasal spray Flonase steroid nasal spray Antihistamine daily such as cetirizine Lots of fluids  

## 2016-09-30 ENCOUNTER — Encounter: Payer: Self-pay | Admitting: Family Medicine

## 2016-09-30 ENCOUNTER — Ambulatory Visit (INDEPENDENT_AMBULATORY_CARE_PROVIDER_SITE_OTHER): Payer: Managed Care, Other (non HMO) | Admitting: Family Medicine

## 2016-09-30 VITALS — BP 129/82 | HR 73 | Temp 97.7°F | Ht 71.0 in | Wt 205.0 lb

## 2016-09-30 DIAGNOSIS — J069 Acute upper respiratory infection, unspecified: Secondary | ICD-10-CM

## 2016-09-30 DIAGNOSIS — S56811A Strain of other muscles, fascia and tendons at forearm level, right arm, initial encounter: Secondary | ICD-10-CM

## 2016-09-30 MED ORDER — CYCLOBENZAPRINE HCL 10 MG PO TABS: 10.0000 mg | ORAL_TABLET | Freq: Three times a day (TID) | ORAL | 0 refills | Status: DC | PRN

## 2016-09-30 MED ORDER — AZELASTINE-FLUTICASONE 137-50 MCG/ACT NA SUSP: 2.0000 | Freq: Every day | NASAL | 9 refills | Status: DC

## 2016-09-30 NOTE — Progress Notes (Signed)
BP 129/82   Pulse 73   Temp 97.7 F (36.5 C) (Oral)   Ht 5\' 11"  (1.803 m)   Wt 205 lb (93 kg)   BMI 28.59 kg/m    Subjective:    Patient ID: Andre Long, male    DOB: 1980/05/18, 36 y.o.   MRN: [id number]  HPI: Andre Long is a 36 y.o. male presenting on 09/30/2016 for Right arm pain (pain in lower arm, worse with use, ongoing for a while but worsened after bowling)   HPI Right forearm pain Patient has a on his forearm in the muscle body between the elbow and the wrist on the posterior side. he says this is been going on for at least a month or 2 but has been worse over the past couple days since he went bowling. He says that in his work he has a lot of gripping and physical labor that is likely what is causing this to flareup. He denies any grip strength loss or weakness but just has the pain on that side. He has been trying to use Advil which is helped some but not completely.  Patient needs a refill of her nasal spray for when her allergies flare up. She is not currently having any issues.  Relevant past medical, surgical, family and social history reviewed and updated as indicated. Interim medical history since our last visit reviewed. Allergies and medications reviewed and updated.  Review of Systems  Constitutional: Negative for chills and fever.  Eyes: Negative for discharge.  Respiratory: Negative for shortness of breath and wheezing.   Cardiovascular: Negative for chest pain and leg swelling.  Musculoskeletal: Positive for myalgias. Negative for back pain and gait problem.  Skin: Negative for rash.  All other systems reviewed and are negative.   Per HPI unless specifically indicated above        Objective:    BP 129/82   Pulse 73   Temp 97.7 F (36.5 C) (Oral)   Ht 5\' 11"  (1.803 m)   Wt 205 lb (93 kg)   BMI 28.59 kg/m   Wt Readings from Last 3 Encounters:  09/30/16 205 lb (93 kg)  06/02/16 199 lb (90.3 kg)  09/15/15 183 lb (83 kg)    Physical Exam    Constitutional: He is oriented to person, place, and time. He appears well-developed and well-nourished. No distress.  Eyes: Conjunctivae are normal. No scleral icterus.  Cardiovascular: Normal rate, regular rhythm, normal heart sounds and intact distal pulses.   No murmur heard. Pulmonary/Chest: Effort normal and breath sounds normal. No respiratory distress. He has no wheezes.  Musculoskeletal: Normal range of motion. He exhibits no edema.       Right forearm: He exhibits tenderness (Tenderness over the muscle body of the posterior forearm. Worse with pronation and supination, likely muscular strain versus tendinitis). He exhibits no bony tenderness, no swelling and no deformity.  Neurological: He is alert and oriented to person, place, and time. Coordination normal.  Skin: Skin is warm and dry. No rash noted. He is not diaphoretic.  Psychiatric: He has a normal mood and affect. His behavior is normal.  Nursing note and vitals reviewed.       Assessment & Plan:   Problem List Items Addressed This Visit    None    Visit Diagnoses    Strain of other muscles, fascia and tendons at forearm level, right arm, initial encounter    -  Primary   Relevant Medications   cyclobenzaprine (  FLEXERIL) 10 MG tablet   Acute upper respiratory infection       refill only of nasal spray   Relevant Medications   Azelastine-Fluticasone 137-50 MCG/ACT SUSP       Follow up plan: Return if symptoms worsen or fail to improve.  Counseling provided for all of the vaccine components No orders of the defined types were placed in this encounter.   Arville CareJoshua Mariacristina Aday, MD [facility] 09/30/2016, 10:06 AM

## 2016-10-03 ENCOUNTER — Telehealth: Payer: Self-pay

## 2016-10-03 NOTE — Telephone Encounter (Signed)
Insurance has rejected Dymista, will cover generic nasal steroids.  Please advise.

## 2016-10-03 NOTE — Telephone Encounter (Signed)
Please let the patient now and if he desires we can send in Flonase and azalastine spray separately

## 2016-10-04 ENCOUNTER — Other Ambulatory Visit: Payer: Self-pay

## 2016-10-04 MED ORDER — FLUTICASONE PROPIONATE 50 MCG/ACT NA SUSP: 2.0000 | Freq: Every day | NASAL | 6 refills | Status: DC

## 2016-10-04 MED ORDER — AZELASTINE HCL 137 MCG/SPRAY NA SOLN: 2.0000 | Freq: Every day | NASAL | 6 refills | Status: DC

## 2016-10-04 NOTE — Telephone Encounter (Signed)
Patient informed, wanted medications sent in separately.

## 2016-10-04 NOTE — Telephone Encounter (Signed)
lmtcb jkp 6/19

## 2017-04-04 ENCOUNTER — Ambulatory Visit (INDEPENDENT_AMBULATORY_CARE_PROVIDER_SITE_OTHER): Payer: Managed Care, Other (non HMO) | Admitting: Ophthalmology

## 2017-04-04 ENCOUNTER — Encounter (INDEPENDENT_AMBULATORY_CARE_PROVIDER_SITE_OTHER): Payer: Self-pay | Admitting: Ophthalmology

## 2017-04-04 DIAGNOSIS — H2513 Age-related nuclear cataract, bilateral: Secondary | ICD-10-CM | POA: Diagnosis not present

## 2017-04-04 DIAGNOSIS — H5704 Mydriasis: Secondary | ICD-10-CM

## 2017-04-04 DIAGNOSIS — H3581 Retinal edema: Secondary | ICD-10-CM | POA: Diagnosis not present

## 2017-04-04 DIAGNOSIS — H209 Unspecified iridocyclitis: Secondary | ICD-10-CM | POA: Diagnosis not present

## 2017-04-04 NOTE — Progress Notes (Signed)
[facility] - Clinic Note  04/04/2017     CHIEF COMPLAINT Patient presents for Retina Evaluation and Eye Problem   HISTORY OF PRESENT ILLNESS: Andre GreavesJamie Long is a 36 y.o. male who presents to the clinic today for:   HPI    Retina Evaluation    In right eye.  This started 4 days ago.  Associated Symptoms Flashes, Pain and Photophobia.  Negative for Floaters, Distortion, Blind Spot, Redness, Glare, Scalp Tenderness, Shoulder/Hip pain, Weight Loss, Fatigue, Fever, Jaw Claudication and Trauma.  Context:  distance vision, mid-range vision and near vision.  Treatments tried include no treatments.  I, the attending physician,  performed the HPI with the patient and updated documentation appropriately.          Comments    Pt presents on the referral of Dr. Lyla SonY. Le for ocular trauma; Pt reports that he was hit with a baseball in OD x 4 days; Pt states OD is sore to touch; Pt reports that OD VA is "dim", reports he is having more trouble in dim lighting; Pt reports he has been seeing flashes toward temporal side; Pt denies wavy VA, denies floaters; Pt states Dr. Conley RollsLe dilated OU around 11 AM today;        Last edited by Rennis ChrisZamora, Charnell Peplinski, MD on 04/04/2017  2:04 PM. (History)      Referring physician: Michaelle CopasLe, Yen Thi Hong, MD [phone] [facility] [address] [place], [place] [id number]  HISTORICAL INFORMATION:   Selected notes from the MEDICAL RECORD NUMBER Referred by Dr. Lyla SonY. Le for ocular trauma -  LEE- 12.18.18 Lyla Son(Y. Le)  Ocular Hx-  PMH-     CURRENT MEDICATIONS: No current outpatient medications on file. (Ophthalmic Drugs)   No current facility-administered medications for this visit.  (Ophthalmic Drugs)   Current Outpatient Medications (Other)  Medication Sig  . Azelastine HCl 137 MCG/SPRAY SOLN Place 2 sprays into the nose daily.  . Azelastine-Fluticasone 137-50 MCG/ACT SUSP Place 2 sprays into the nose daily.  . cyclobenzaprine (FLEXERIL) 10 MG tablet Take 1 tablet (10 mg total) by mouth 3  (three) times daily as needed for muscle spasms.  . fluticasone (FLONASE) 50 MCG/ACT nasal spray Place 2 sprays into both nostrils daily.  Marland Kitchen. loratadine (CLARITIN) 10 MG tablet Take 10 mg by mouth daily. OTC  [facility] comparable brand   No current facility-administered medications for this visit.  (Other)      REVIEW OF SYSTEMS:    ALLERGIES Allergies  Allergen Reactions  . Sulfa Antibiotics     PAST MEDICAL HISTORY Past Medical History:  Diagnosis Date  . Decrease in appetite   . RLQ abdominal pain    possible appendicitis   . URI (upper respiratory infection)    Past Surgical History:  Procedure Laterality Date  . APPENDECTOMY    . HERNIA REPAIR    . wrist sx      FAMILY HISTORY Family History  Problem Relation Age of Onset  . Diabetes Unknown        family history   . Amblyopia Neg Hx   . Blindness Neg Hx   . Cataracts Neg Hx   . Glaucoma Neg Hx   . Macular degeneration Neg Hx   . Strabismus Neg Hx   . Retinal detachment Neg Hx   . Retinitis pigmentosa Neg Hx     SOCIAL HISTORY Social History   Tobacco Use  . Smoking status: Former Smoker    Last attempt to quit: 03/07/2001  Years since quitting: 16.0  . Smokeless tobacco: Never Used  Substance Use Topics  . Alcohol use: No  . Drug use: No         OPHTHALMIC EXAM:  Base Eye Exam    Visual Acuity (Snellen - Linear)      Right Left   Dist Halfway 20/30 20/20   Dist ph  20/20 20/20       Tonometry (Tonopen, 1:34 PM)      Right Left   Pressure 18 18       Pupils      Dark Shape APD   Right 6 Round Trace   Left 5 Round None  Pharm dilated prior to visit       Visual Fields (Counting fingers)      Left Right    Full Full       Extraocular Movement      Right Left    Full, Ortho Full, Ortho       Neuro/Psych    Oriented x3:  Yes   Mood/Affect:  Normal       Dilation    Both eyes:  1.0% Mydriacyl, 2.5% Phenylephrine @ 1:34 PM        Slit Lamp and Fundus Exam    Slit  Lamp Exam      Right Left   Lids/Lashes Normal Normal   Conjunctiva/Sclera White and quiet White and quiet   Cornea Clear Clear   Anterior Chamber 3-4+ Cell and pigment, deep, no hypopyon or hyphema Deep and quiet   Iris Round and dilated to 7mm Round and reactive   Lens 1+ Nuclear sclerosis, 1+ Cortical cataract 1+ Nuclear sclerosis, 1+ Cortical cataract   Vitreous Normal Normal       Fundus Exam      Right Left   Disc Normal, no edema Normal   C/D Ratio 0.25 0.25   Macula Good foveal reflex, Retinal pigment epithelial mottling Good foveal reflex, Retinal pigment epithelial mottling   Vessels Normal Normal   Periphery Attached; no RT/RD; Single blot heme at 0130, pigmented chorioretinal atrophy at 0300 attached          IMAGING AND PROCEDURES  Imaging and Procedures for 04/05/17  OCT, Retina - OU - Both Eyes     Right Eye Quality was good. Central Foveal Thickness: 278. Progression has no prior data. Findings include normal foveal contour, no IRF, no SRF.   Left Eye Quality was good. Central Foveal Thickness: 264. Progression has no prior data. Findings include normal foveal contour, no IRF, no SRF.   Notes *Images captured and stored on drive  Diagnosis / Impression:  NFP, No IRF/SRF OU  Clinical management:  See below  Abbreviations: NFP - Normal foveal profile. CME - cystoid macular edema. PED - pigment epithelial detachment. IRF - intraretinal fluid. SRF - subretinal fluid. EZ - ellipsoid zone. ERM - epiretinal membrane. ORA - outer retinal atrophy. ORT - outer retinal tubulation. SRHM - subretinal hyper-reflective material                  ASSESSMENT/PLAN:    ICD-10-CM   1. 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 Retinal edema H35.81 OCT, Retina - OU - Both Eyes  4. 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 Traumatic iritis and mydriasis OD - mechanism of trauma -- baseball to right eye - 3-4+ cell/pigment in [facility] and  pupil minimally reactive, but reactive OD -  no hyphema or hypopyon - single dot heme in peripheral retina OD, but no other retinal pathology -- no RT/RD - agree with aggressive PF q2h OD and subsequent taper - recommend f/u in 1 wk with Dr. Conley RollsLe to make sure inflammation is improving - letter dictated to Dr. Conley RollsLe  - f/u here prn  3. No retinal edema on exam or OCT  Ophthalmic Meds Ordered this visit:  No orders of the defined types were placed in this encounter.      Return if symptoms worsen or fail to improve.  There are no Patient Instructions on file for this visit.   Explained the diagnoses, plan, and follow up with the patient and they expressed understanding.  Patient expressed understanding of the importance of proper follow up care.   This document serves as a record of services personally performed by Karie ChimeraBrian G. Joory Gough, MD, PhD. It was created on their behalf by Virgilio BellingMeredith Fabian, COA, a certified ophthalmic assistant. The creation of this record is the provider's dictation and/or activities during the visit.  Electronically signed by: Virgilio BellingMeredith Fabian, COA  04/05/17 1:23 PM    Karie ChimeraBrian G. Olubunmi Rothenberger, M.D., Ph.D. Diseases & Surgery of the Retina and Vitreous [facility] 04/05/17  I have reviewed the above documentation for accuracy and completeness, and I agree with the above. Karie ChimeraBrian G. Mali Eppard, M.D., Ph.D. 04/05/17 1:23 PM    Abbreviations: M myopia (nearsighted); A astigmatism; H hyperopia (farsighted); P presbyopia; Mrx spectacle prescription;  CTL contact lenses; OD right eye; OS left eye; OU both eyes  XT exotropia; ET esotropia; PEK punctate epithelial keratitis; PEE punctate epithelial erosions; DES dry eye syndrome; MGD meibomian gland dysfunction; ATs artificial tears; PFAT's preservative free artificial tears; NSC nuclear sclerotic cataract; PSC posterior subcapsular cataract; ERM epi-retinal membrane; PVD posterior vitreous detachment; RD retinal  detachment; DM diabetes mellitus; DR diabetic retinopathy; NPDR non-proliferative diabetic retinopathy; PDR proliferative diabetic retinopathy; CSME clinically significant macular edema; DME diabetic macular edema; dbh dot blot hemorrhages; CWS cotton wool spot; POAG primary open angle glaucoma; C/D cup-to-disc ratio; HVF humphrey visual field; GVF goldmann visual field; OCT optical coherence tomography; IOP intraocular pressure; BRVO Branch retinal vein occlusion; CRVO central retinal vein occlusion; CRAO central retinal artery occlusion; BRAO branch retinal artery occlusion; RT retinal tear; SB scleral buckle; PPV pars plana vitrectomy; VH Vitreous hemorrhage; PRP panretinal laser photocoagulation; IVK intravitreal kenalog; VMT vitreomacular traction; MH Macular hole;  NVD neovascularization of the disc; NVE neovascularization elsewhere; AREDS age related eye disease study; ARMD age related macular degeneration; POAG primary open angle glaucoma; EBMD epithelial/anterior basement membrane dystrophy; ACIOL anterior chamber intraocular lens; IOL intraocular lens; PCIOL posterior chamber intraocular lens; Phaco/IOL phacoemulsification with intraocular lens placement; PRK photorefractive keratectomy; LASIK laser assisted in situ keratomileusis; HTN hypertension; DM diabetes mellitus; COPD chronic obstructive pulmonary disease

## 2017-04-05 ENCOUNTER — Encounter (INDEPENDENT_AMBULATORY_CARE_PROVIDER_SITE_OTHER): Payer: Self-pay | Admitting: Ophthalmology

## 2017-05-26 ENCOUNTER — Ambulatory Visit: Payer: Managed Care, Other (non HMO) | Admitting: Pediatrics

## 2017-05-26 ENCOUNTER — Encounter: Payer: Self-pay | Admitting: Pediatrics

## 2017-05-26 VITALS — BP 137/89 | HR 107 | Temp 100.9°F | Resp 20 | Ht 71.0 in | Wt 203.0 lb

## 2017-05-26 DIAGNOSIS — J069 Acute upper respiratory infection, unspecified: Secondary | ICD-10-CM

## 2017-05-26 DIAGNOSIS — R6889 Other general symptoms and signs: Secondary | ICD-10-CM

## 2017-05-26 LAB — VERITOR FLU A/B WAIVED
Influenza A: NEGATIVE
Influenza B: NEGATIVE

## 2017-05-26 NOTE — Patient Instructions (Signed)
Nasal congestion: Netipot with distilled water 2-3 times a day to clear out sinuses  Or Normal saline nasal spray Flonase steroid nasal spray  For sore throat can use: Ibuprofen 400- 600mg three times a day Throat lozenges chloroseptic spray  Stick with bland foods Drink lots of fluids  

## 2017-05-26 NOTE — Progress Notes (Signed)
  Subjective:   Patient ID: Francia GreavesJamie Barrell, male    DOB: 12-Nov-1980, 37 y.o.   MRN: [id number] CC: Chills; Nasal Congestion  HPI: Francia GreavesJamie Caruthers is a 37 y.o. male presenting for Chills; Nasal Congestion  Congestion and some cough started about 5 days ago. Started feeling bad two days ago. Loose stools x7-8 yesterday. Went again about 3 times over night and 3 today.   Feels hungry but doesn't want to eat because he will have to run to bathroom right afterwards No blood in stools, no abdominal pain.  No ear pain, no sore throat.  Relevant past medical, surgical, family and social history reviewed. Allergies and medications reviewed and updated. Social History   Tobacco Use  Smoking Status Former Smoker  . Last attempt to quit: 03/07/2001  . Years since quitting: 16.2  Smokeless Tobacco Never Used   ROS: Per HPI   Objective:    BP 137/89   Pulse (!) 107   Temp (!) 100.9 F (38.3 C) (Oral)   Resp 20   Ht 5\' 11"  (1.803 m)   Wt 203 lb (92.1 kg)   SpO2 96%   BMI 28.31 kg/m   Wt Readings from Last 3 Encounters:  05/26/17 203 lb (92.1 kg)  09/30/16 205 lb (93 kg)  06/02/16 199 lb (90.3 kg)    Gen: NAD, alert, cooperative with exam, NCAT, congested EYES: EOMI, no conjunctival injection, or no icterus ENT:  TMs white fluid R TM, nl L TM, OP with mild erythema CV: NRRR, normal S1/S2, no murmur, distal pulses 2+ b/l Resp: CTABL, no wheezes, normal WOB Abd: +BS, soft, NTND. no guarding or organomegaly Ext: No edema, warm Neuro: Alert and oriented, strength equal b/l UE and LE, coordination grossly normal MSK: normal muscle bulk  Assessment & Plan:  Asher MuirJamie was seen today for chills, nasal congestion.  Diagnoses and all orders for this visit:  Flu-like symptoms Flu neg  Acute URI Discussed symptomatic care, return precautions Note given for work. Should be fever free before returning to activities around people.  Follow up plan: prn Rex Krasarol [place] Spainhower, MD [facility]

## 2017-06-02 ENCOUNTER — Ambulatory Visit: Payer: Managed Care, Other (non HMO) | Admitting: Family Medicine

## 2017-06-02 ENCOUNTER — Telehealth: Payer: Self-pay | Admitting: Pediatrics

## 2017-06-02 ENCOUNTER — Encounter: Payer: Self-pay | Admitting: Family Medicine

## 2017-06-02 VITALS — BP 125/81 | HR 88 | Temp 100.1°F | Ht 71.0 in | Wt 196.0 lb

## 2017-06-02 DIAGNOSIS — J0110 Acute frontal sinusitis, unspecified: Secondary | ICD-10-CM | POA: Diagnosis not present

## 2017-06-02 MED ORDER — AZITHROMYCIN 250 MG PO TABS: ORAL_TABLET | ORAL | 0 refills | Status: DC

## 2017-06-02 NOTE — Progress Notes (Signed)
BP 125/81   Pulse 88   Temp 100.1 F (37.8 C) (Oral)   Ht 5\' 11"  (1.803 m)   Wt 196 lb (88.9 kg)   BMI 27.34 kg/m    Subjective:    Patient ID: Andre Long, male    DOB: 25-Nov-1980, 37 y.o.   MRN: [id number]  HPI: Andre Long is a 37 y.o. male presenting on 06/02/2017 for Cough, fever, loss of appetite, sinus congestion (seen last week, has worsened)   HPI Fever cough congestion and sinus drainage Patient is coming in with fever and cough and congestion and sinus drainage is been going on for the past week.  He was seen last week and tested negative for the flu and had a low-grade fever at that time when he felt like he has been more warm and had more of a fever since then.  His temperature today in the office is 100.1.  He says is just not get any better and is actually getting worse.  He has been using an as a lasting nasal spray and he takes loratadine and has been using NyQuil and Tylenol without much success.  He denies any shortness of breath or wheezing.  He denies any sick contacts that he knows of but he does have 4 children so things are passed around his house very frequently.  Relevant past medical, surgical, family and social history reviewed and updated as indicated. Interim medical history since our last visit reviewed. Allergies and medications reviewed and updated.  Review of Systems  Constitutional: Positive for chills and fever.  HENT: Positive for congestion, postnasal drip, rhinorrhea, sinus pressure, sneezing and sore throat. Negative for ear discharge, ear pain and voice change.   Eyes: Negative for pain, discharge, redness and visual disturbance.  Respiratory: Positive for cough. Negative for shortness of breath and wheezing.   Cardiovascular: Negative for chest pain and leg swelling.  Musculoskeletal: Negative for gait problem.  Skin: Negative for rash.  All other systems reviewed and are negative.   Per HPI unless specifically indicated  above   Allergies as of 06/02/2017      Reactions   Sulfa Antibiotics       Medication List        Accurate as of 06/02/17 11:32 AM. Always use your most recent med list.          Azelastine HCl 137 MCG/SPRAY Soln Place 2 sprays into the nose daily.   azithromycin 250 MG tablet Commonly known as:  ZITHROMAX Take 2 the first day and then one each day after.   cyclobenzaprine 10 MG tablet Commonly known as:  FLEXERIL Take 1 tablet (10 mg total) by mouth 3 (three) times daily as needed for muscle spasms.   loratadine 10 MG tablet Commonly known as:  CLARITIN Take 10 mg by mouth daily. OTC  [facility] comparable brand          Objective:    BP 125/81   Pulse 88   Temp 100.1 F (37.8 C) (Oral)   Ht 5\' 11"  (1.803 m)   Wt 196 lb (88.9 kg)   BMI 27.34 kg/m   Wt Readings from Last 3 Encounters:  06/02/17 196 lb (88.9 kg)  05/26/17 203 lb (92.1 kg)  09/30/16 205 lb (93 kg)    Physical Exam  Constitutional: He is oriented to person, place, and time. He appears well-developed and well-nourished. No distress.  HENT:  Right Ear: Tympanic membrane, external ear and ear canal normal.  Left  Ear: Tympanic membrane, external ear and ear canal normal.  Nose: Mucosal edema and rhinorrhea present. No sinus tenderness. No epistaxis. Right sinus exhibits maxillary sinus tenderness. Right sinus exhibits no frontal sinus tenderness. Left sinus exhibits maxillary sinus tenderness. Left sinus exhibits no frontal sinus tenderness.  Mouth/Throat: Uvula is midline and mucous membranes are normal. Posterior oropharyngeal edema and posterior oropharyngeal erythema present. No oropharyngeal exudate or tonsillar abscesses.  Eyes: Conjunctivae and EOM are normal. Pupils are equal, round, and reactive to light. No scleral icterus.  Neck: Neck supple. No thyromegaly present.  Cardiovascular: Normal rate, regular rhythm, normal heart sounds and intact distal pulses.  No murmur  heard. Pulmonary/Chest: Effort normal and breath sounds normal. No respiratory distress. He has no wheezes. He has no rales.  Musculoskeletal: Normal range of motion. He exhibits no edema.  Lymphadenopathy:    He has no cervical adenopathy.  Neurological: He is alert and oriented to person, place, and time. Coordination normal.  Skin: Skin is warm and dry. No rash noted. He is not diaphoretic.  Psychiatric: He has a normal mood and affect. His behavior is normal.  Nursing note and vitals reviewed.     Assessment & Plan:   Problem List Items Addressed This Visit    None    Visit Diagnoses    Acute non-recurrent frontal sinusitis    -  Primary   Relevant Medications   azithromycin (ZITHROMAX) 250 MG tablet       Follow up plan: Return if symptoms worsen or fail to improve.  Counseling provided for all of the vaccine components No orders of the defined types were placed in this encounter.   Arville CareJoshua Dettinger, MD [facility] 06/02/2017, 11:32 AM

## 2017-06-27 ENCOUNTER — Ambulatory Visit (INDEPENDENT_AMBULATORY_CARE_PROVIDER_SITE_OTHER): Payer: Managed Care, Other (non HMO) | Admitting: Pediatrics

## 2017-06-27 ENCOUNTER — Encounter: Payer: Self-pay | Admitting: Pediatrics

## 2017-06-27 VITALS — BP 134/90 | HR 88 | Temp 97.4°F | Ht 71.0 in | Wt 201.2 lb

## 2017-06-27 DIAGNOSIS — Z0000 Encounter for general adult medical examination without abnormal findings: Secondary | ICD-10-CM

## 2017-06-27 DIAGNOSIS — Z6828 Body mass index (BMI) 28.0-28.9, adult: Secondary | ICD-10-CM

## 2017-06-27 DIAGNOSIS — K625 Hemorrhage of anus and rectum: Secondary | ICD-10-CM

## 2017-06-27 NOTE — Progress Notes (Signed)
  Subjective:   Patient ID: Andre Long, male    DOB: March 19, 1981, 37 y.o.   MRN: [id number] CC: Annual Exam  HPI: Andre Long is a 37 y.o. male presenting for Annual Exam  Generally has been feeling well.  Was diagnosed with hemorrhoids almost 2 years ago after episode of rectal bleeding.  Resolved within a couple days.  Since then about 4 times he has noticed a spot of blood on toilet paper, sometimes with straining frequent bowel movements.  Last time was when he was on antibiotics about a month ago and having frequent bowel movements.  No family history of colon cancer.  No pain with passing stools.  Appetite is been fine.  Remains active.  Has 4 kids who play sports.  He is running multiple days a week.  Notices more stomach discomfort when he eats very greasy foods.  Trying to avoid greasy foods.  Relevant past medical, surgical, family and social history reviewed. Allergies and medications reviewed and updated. Social History   Tobacco Use  Smoking Status Former Smoker  . Last attempt to quit: 03/07/2001  . Years since quitting: 16.3  Smokeless Tobacco Never Used   ROS: Per HPI   Objective:    BP 134/90   Pulse 88   Temp (!) 97.4 F (36.3 C) (Oral)   Ht '5\' 11"'$  (1.803 m)   Wt 201 lb 3.2 oz (91.3 kg)   BMI 28.06 kg/m   Wt Readings from Last 3 Encounters:  06/27/17 201 lb 3.2 oz (91.3 kg)  06/02/17 196 lb (88.9 kg)  05/26/17 203 lb (92.1 kg)    Gen: NAD, alert, cooperative with exam, NCAT EYES: EOMI, no conjunctival injection, or no icterus ENT:  TMs pearly gray b/l, OP without erythema LYMPH: no cervical LAD CV: NRRR, normal S1/S2, no murmur, distal pulses 2+ b/l Resp: CTABL, no wheezes, normal WOB Abd: +BS, soft, NTND. no guarding or organomegaly Ext: No edema, warm Neuro: Alert and oriented, strength equal b/l UE and LE, coordination grossly normal MSK: normal muscle bulk  Assessment & Plan:  Andre Long was seen today for annual exam.  Diagnoses and all orders for  this visit:  Encounter for preventive care -     Lipid panel -     BMP8+EGFR  Rectal bleeding Suspect anal fissure versus irritation.  Minimal amount of bloody, just on toilet paper.  Return precautions discussed. -     CBC with Differential/Platelet  BMI 28.0-28.9,adult Continue lifestyle changes.  Follow up plan: Return in about 1 year (around 06/28/2018). Andre Found, MD [place]

## 2017-06-28 LAB — BMP8+EGFR
BUN/Creatinine Ratio: 15 (ref 9–20)
BUN: 14 mg/dL (ref 6–20)
CALCIUM: 9.4 mg/dL (ref 8.7–10.2)
CHLORIDE: 101 mmol/L (ref 96–106)
CO2: 22 mmol/L (ref 20–29)
Creatinine, Ser: 0.95 mg/dL (ref 0.76–1.27)
GFR calc Af Amer: 116 mL/min/{1.73_m2} (ref >59)
GFR calc non Af Amer: 100 mL/min/{1.73_m2} (ref >59)
Glucose: 87 mg/dL (ref 65–99)
POTASSIUM: 4.1 mmol/L (ref 3.5–5.2)
Sodium: 139 mmol/L (ref 134–144)

## 2017-06-28 LAB — LIPID PANEL
CHOL/HDL RATIO: 4.3 ratio (ref 0.0–5.0)
CHOLESTEROL TOTAL: 200 mg/dL — AB (ref 100–199)
HDL: 46 mg/dL (ref >39)
LDL Calculated: 108 mg/dL — ABNORMAL HIGH (ref 0–99)
Triglycerides: 232 mg/dL — ABNORMAL HIGH (ref 0–149)
VLDL Cholesterol Cal: 46 mg/dL — ABNORMAL HIGH (ref 5–40)

## 2017-06-28 LAB — CBC WITH DIFFERENTIAL/PLATELET
BASOS ABS: 0.1 10*3/uL (ref 0.0–0.2)
Basos: 1 %
EOS (ABSOLUTE): 0.3 10*3/uL (ref 0.0–0.4)
Eos: 4 %
HEMOGLOBIN: 15.5 g/dL (ref 13.0–17.7)
Hematocrit: 44.6 % (ref 37.5–51.0)
IMMATURE GRANULOCYTES: 0 %
Immature Grans (Abs): 0 10*3/uL (ref 0.0–0.1)
LYMPHS: 22 %
Lymphocytes Absolute: 1.7 10*3/uL (ref 0.7–3.1)
MCH: 30.2 pg (ref 26.6–33.0)
MCHC: 34.8 g/dL (ref 31.5–35.7)
MCV: 87 fL (ref 79–97)
MONOCYTES: 11 %
Monocytes Absolute: 0.8 10*3/uL (ref 0.1–0.9)
NEUTROS ABS: 4.7 10*3/uL (ref 1.4–7.0)
NEUTROS PCT: 62 %
PLATELETS: 269 10*3/uL (ref 150–379)
RBC: 5.13 x10E6/uL (ref 4.14–5.80)
RDW: 14.2 % (ref 12.3–15.4)
WBC: 7.6 10*3/uL (ref 3.4–10.8)

## 2017-11-04 IMAGING — CR DG FINGER THUMB 2+V*L*
3 series · 3 of 3 positions shown · non-contrast
Comparison: None.

CLINICAL DATA: Left thumb pain, no acute injury

EXAM:
LEFT THUMB 2+V

[view not recorded (1 of 3)]
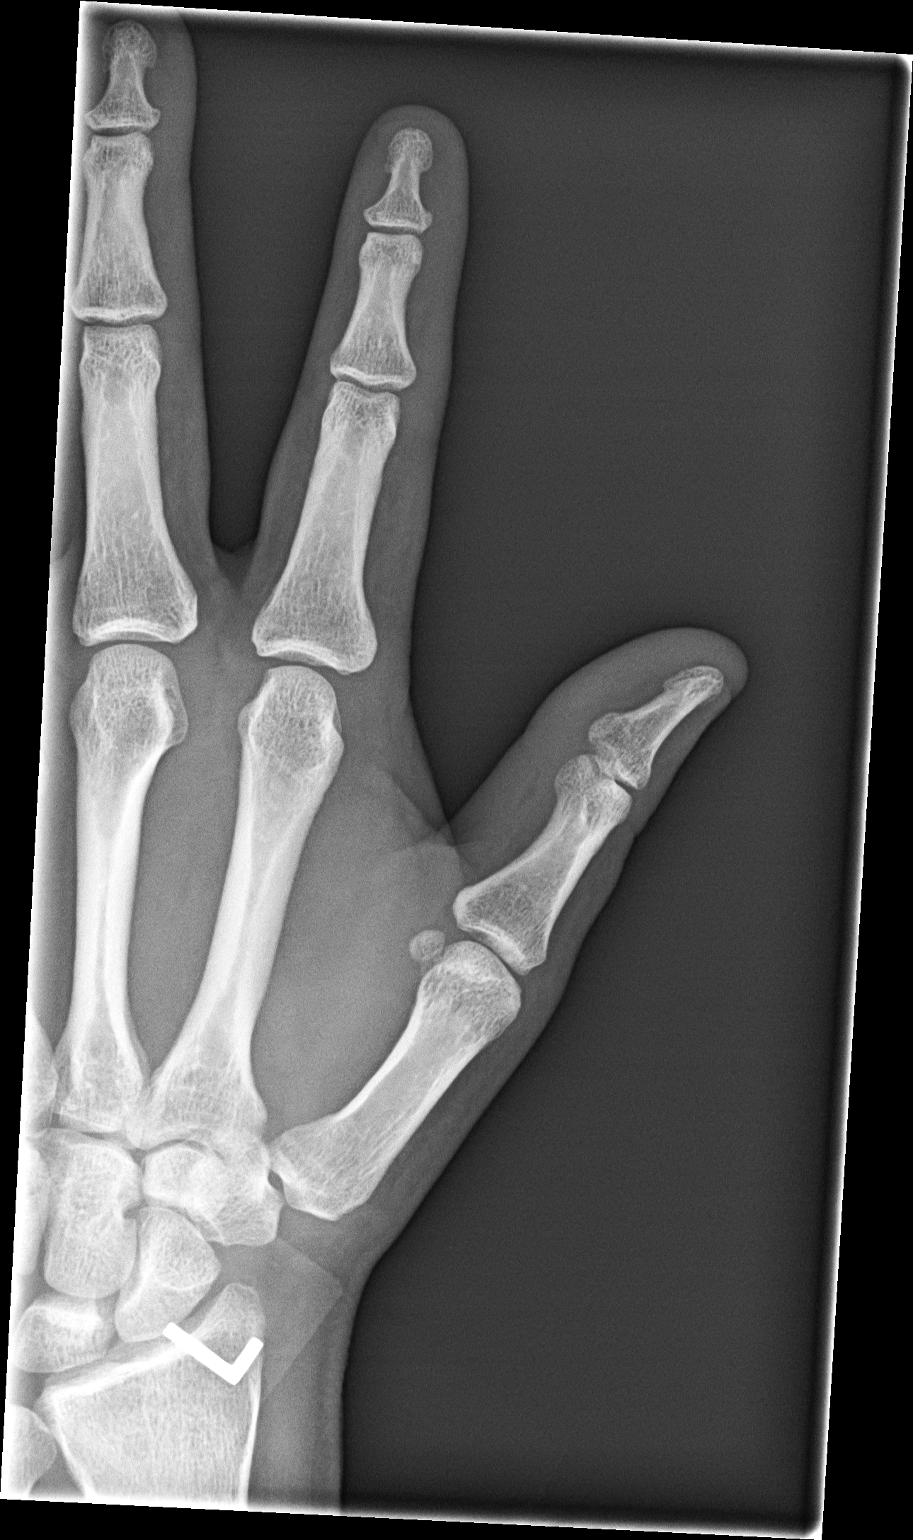

[view not recorded (2 of 3)]
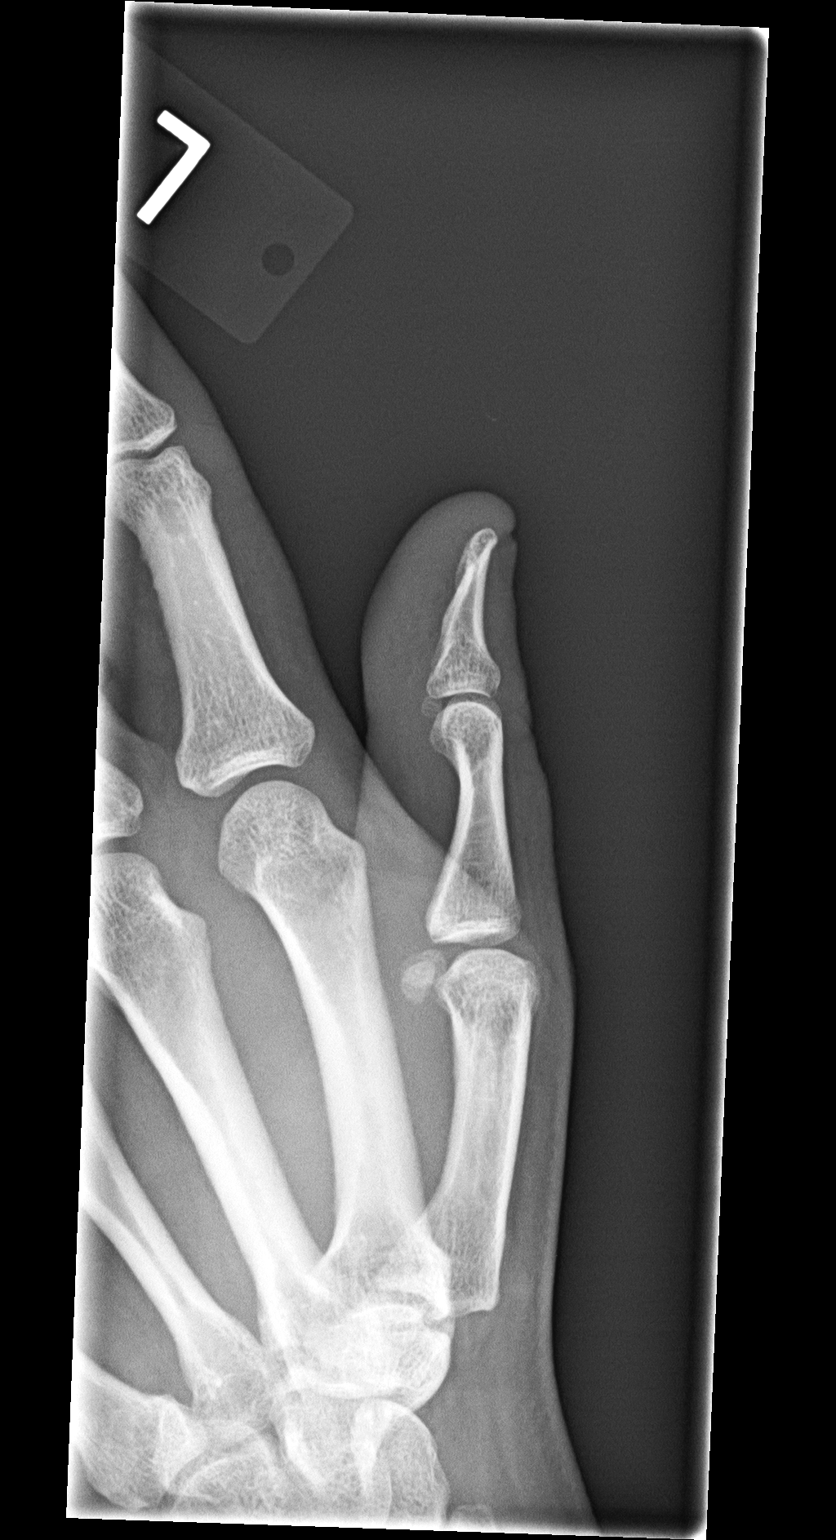

[view not recorded (3 of 3)]
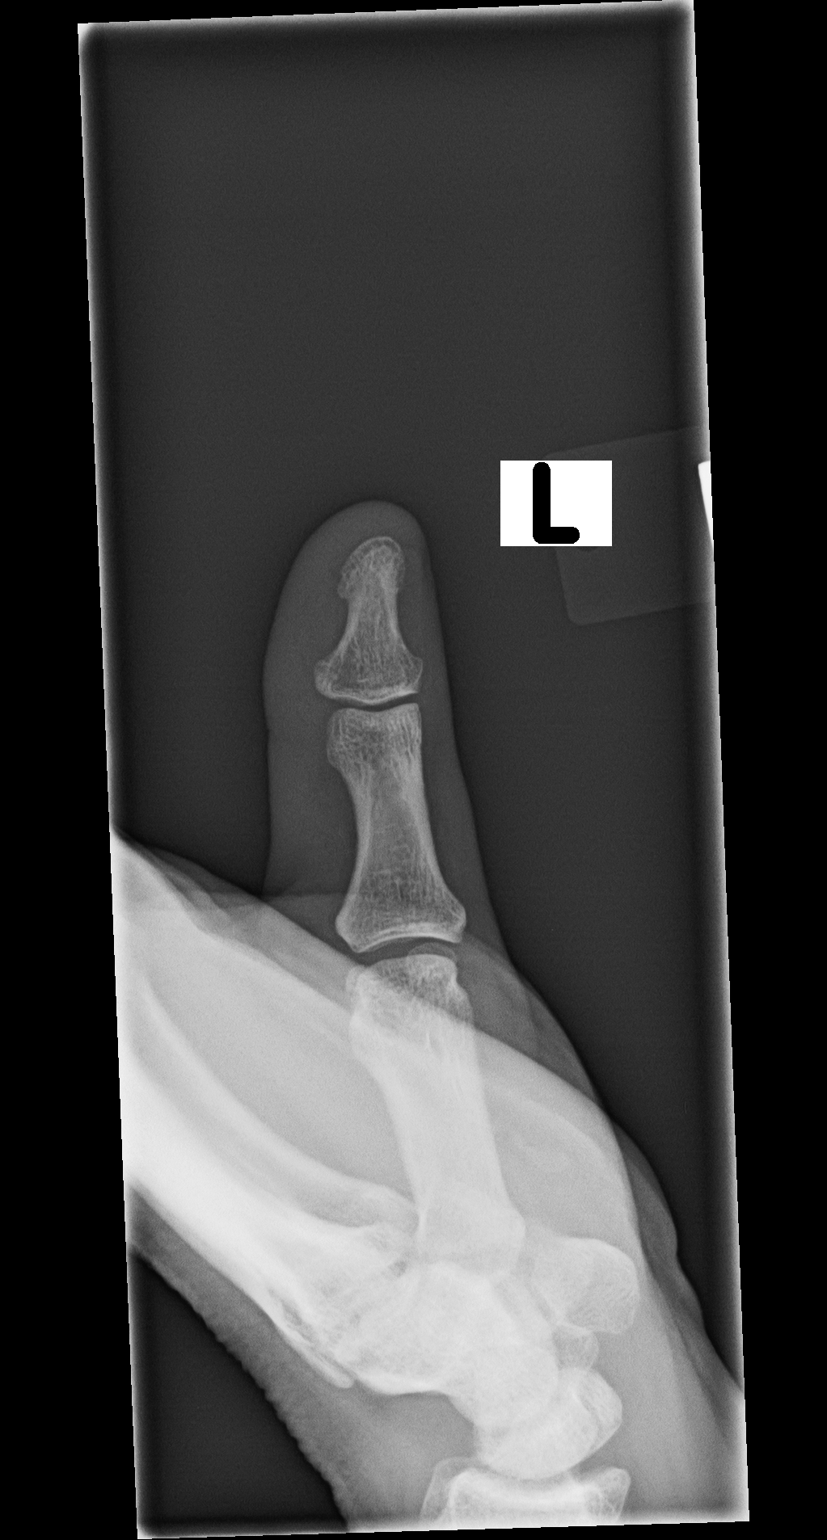

[3 of 3 positions shown; findings below may reference images not displayed]

FINDINGS: No acute fracture is seen. Alignment is normal. Joint spaces appear
normal. No erosion is noted.
IMPRESSION: Negative.

## 2018-09-28 ENCOUNTER — Other Ambulatory Visit: Payer: Self-pay

## 2018-10-01 ENCOUNTER — Encounter: Payer: Self-pay | Admitting: Family Medicine

## 2018-10-01 ENCOUNTER — Ambulatory Visit (INDEPENDENT_AMBULATORY_CARE_PROVIDER_SITE_OTHER): Payer: Managed Care, Other (non HMO) | Admitting: Family Medicine

## 2018-10-01 ENCOUNTER — Other Ambulatory Visit: Payer: Self-pay

## 2018-10-01 VITALS — BP 133/89 | HR 79 | Temp 98.2°F | Ht 71.0 in | Wt 219.4 lb

## 2018-10-01 DIAGNOSIS — Z0001 Encounter for general adult medical examination with abnormal findings: Secondary | ICD-10-CM | POA: Diagnosis not present

## 2018-10-01 DIAGNOSIS — E782 Mixed hyperlipidemia: Secondary | ICD-10-CM

## 2018-10-01 DIAGNOSIS — Z6830 Body mass index (BMI) 30.0-30.9, adult: Secondary | ICD-10-CM

## 2018-10-01 DIAGNOSIS — R202 Paresthesia of skin: Secondary | ICD-10-CM

## 2018-10-01 NOTE — Patient Instructions (Signed)

## 2018-10-01 NOTE — Progress Notes (Signed)
Subjective:  Patient ID: Andre Long, male    DOB: 04/07/1981, 38 y.o.   MRN: [id number]  Chief Complaint:  Annual Exam and Numbness (hands and legs )   HPI: Andre Long is a 38 y.o. male presenting on 10/01/2018 for Annual Exam and Numbness (hands and legs )  Pt states he is doing well overall. Pt states he has had an increase in weight due to lack of exercise and not following a healthy diet. States he eats whatever is fast. 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 States he has not been active due to this. Pt states he is also having issues with tingling in his feet and hands. States this started several months ago. States it is intermittent. States it is worse with driving or crossing legs. Pt states he has not tried anything for the symptoms. States it only lasts for a few minutes when it happens. He does not take a multivitamin daily. His last lipid panel was abnormal. He was trying lifestyle modifications to try to correct this. We will recheck labs today. Pt due for Tdap, pt declines.    Relevant past medical, surgical, family, and social history reviewed and updated as indicated.  Allergies and medications reviewed and updated.   Past Medical History:  Diagnosis Date  . Decrease in appetite   . RLQ abdominal pain    possible appendicitis   . URI (upper respiratory infection)     Past Surgical History:  Procedure Laterality Date  . APPENDECTOMY    . HERNIA REPAIR    . wrist sx      Social History   Socioeconomic History  . Marital status: Married    Spouse name: Not on file  . Number of children: Not on file  . Years of education: Not on file  . Highest education level: Not on file  Occupational History  . Not on file  Social Needs  . Financial resource strain: Not on file  . Food insecurity    Worry: Not on file    Inability: Not on file  . Transportation needs    Medical: Not on file    Non-medical: Not on file  Tobacco Use  . Smoking status:  Former Smoker    Quit date: 03/07/2001    Years since quitting: 17.5  . Smokeless tobacco: Never Used  Substance and Sexual Activity  . Alcohol use: No  . Drug use: No  . Sexual activity: Yes  Lifestyle  . Physical activity    Days per week: Not on file    Minutes per session: Not on file  . Stress: Not on file  Relationships  . Social Herbalist on phone: Not on file    Gets together: Not on file    Attends religious service: Not on file    Active member of club or organization: Not on file    Attends meetings of clubs or organizations: Not on file    Relationship status: Not on file  . Intimate partner violence    Fear of current or ex partner: Not on file    Emotionally abused: Not on file    Physically abused: Not on file    Forced sexual activity: Not on file  Other Topics Concern  . Not on file  Social History Narrative  . Not on file    Outpatient Encounter Medications as of 10/01/2018  Medication Sig  . cyclobenzaprine (FLEXERIL) 10 MG  tablet Take 1 tablet (10 mg total) by mouth 3 (three) times daily as needed for muscle spasms.  Marland Kitchen loratadine (CLARITIN) 10 MG tablet Take 10 mg by mouth daily. OTC  [facility] comparable brand  . [DISCONTINUED] Azelastine HCl 137 MCG/SPRAY SOLN Place 2 sprays into the nose daily. (Patient not taking: Reported on 10/01/2018)   No facility-administered encounter medications on file as of 10/01/2018.     Allergies  Allergen Reactions  . Sulfa Antibiotics     Review of Systems  Constitutional: Negative for activity change, appetite change, chills, diaphoresis, fatigue, fever and unexpected weight change.  Eyes: Negative for photophobia and visual disturbance.  Respiratory: Negative for cough, choking and shortness of breath.   Cardiovascular: Negative for chest pain, palpitations and leg swelling.  Gastrointestinal: Negative for abdominal distention, abdominal pain, anal bleeding, blood in stool, constipation, diarrhea,  nausea, rectal pain and vomiting.  Endocrine: Negative for cold intolerance, heat intolerance, polydipsia, polyphagia and polyuria.  Genitourinary: Negative for difficulty urinating and urgency.  Musculoskeletal: Negative for arthralgias and myalgias.  Skin: Negative for color change, pallor, rash and wound.  Neurological: Negative for dizziness, tremors, seizures, syncope, facial asymmetry, speech difficulty, weakness, light-headedness, numbness and headaches.       Tingling of hands and feet  Hematological: Does not bruise/bleed easily.  Psychiatric/Behavioral: Negative for confusion and sleep disturbance.  All other systems reviewed and are negative.       Objective:  BP 133/89   Pulse 79   Temp 98.2 F (36.8 C) (Oral)   Ht _0  (1.803 m)   Wt 219 lb 6.4 oz (99.5 kg)   BMI 30.60 kg/m    Wt Readings from Last 3 Encounters:  10/01/18 219 lb 6.4 oz (99.5 kg)  06/27/17 201 lb 3.2 oz (91.3 kg)  06/02/17 196 lb (88.9 kg)    Physical Exam Vitals signs and nursing note reviewed.  Constitutional:      General: He is not in acute distress.    Appearance: Normal appearance. He is well-developed, well-groomed and overweight. He is not ill-appearing, toxic-appearing or diaphoretic.  HENT:     Head: Normocephalic and atraumatic.     Jaw: There is normal jaw occlusion.     Right Ear: Hearing, tympanic membrane, ear canal and external ear normal.     Left Ear: Hearing, tympanic membrane, ear canal and external ear normal.     Nose: Nose normal.     Mouth/Throat:     Lips: Pink.     Mouth: Mucous membranes are moist.     Pharynx: Oropharynx is clear. Uvula midline.  Eyes:     General: Lids are normal.     Extraocular Movements: Extraocular movements intact.     Conjunctiva/sclera: Conjunctivae normal.     Pupils: Pupils are equal, round, and reactive to light.  Neck:     Musculoskeletal: Normal range of motion and neck supple.     Thyroid: No thyroid mass, thyromegaly or  thyroid tenderness.     Vascular: No carotid bruit or JVD.     Trachea: Trachea and phonation normal.  Cardiovascular:     Rate and Rhythm: Normal rate and regular rhythm.     Chest Wall: PMI is not displaced.     Pulses: Normal pulses.     Heart sounds: Normal heart sounds. No murmur. No friction rub. No gallop.   Pulmonary:     Effort: Pulmonary effort is normal. No respiratory distress.     Breath sounds: Normal breath  sounds. No wheezing.  Abdominal:     General: Bowel sounds are normal. There is no distension or abdominal bruit.     Palpations: Abdomen is soft. There is no hepatomegaly or splenomegaly.     Tenderness: There is no abdominal tenderness. There is no right CVA tenderness or left CVA tenderness.     Hernia: No hernia is present.  Musculoskeletal: Normal range of motion.     Right lower leg: No edema.     Left lower leg: No edema.     Comments: Negative Phalen's test.   Lymphadenopathy:     Cervical: No cervical adenopathy.  Skin:    General: Skin is warm and dry.     Capillary Refill: Capillary refill takes less than 2 seconds.     Coloration: Skin is not cyanotic, jaundiced or pale.     Findings: No rash.  Neurological:     General: No focal deficit present.     Mental Status: He is alert and oriented to person, place, and time.     Cranial Nerves: Cranial nerves are intact.     Sensory: Sensation is intact.     Motor: Motor function is intact.     Coordination: Coordination is intact.     Gait: Gait is intact.     Deep Tendon Reflexes: Reflexes are normal and symmetric.  Psychiatric:        Attention and Perception: Attention and perception normal.        Mood and Affect: Mood and affect normal.        Speech: Speech normal.        Behavior: Behavior normal. Behavior is cooperative.        Thought Content: Thought content normal.        Cognition and Memory: Cognition and memory normal.        Judgment: Judgment normal.     Results for orders placed  or performed in visit on 06/27/17  Lipid panel  Result Value Ref Range   Cholesterol, Total 200 (H) 100 - 199 mg/dL   Triglycerides 232 (H) 0 - 149 mg/dL   HDL 46 >39 mg/dL   VLDL Cholesterol Cal 46 (H) 5 - 40 mg/dL   LDL Calculated 108 (H) 0 - 99 mg/dL   Chol/HDL Ratio 4.3 0.0 - 5.0 ratio  BMP8+EGFR  Result Value Ref Range   Glucose 87 65 - 99 mg/dL   BUN 14 6 - 20 mg/dL   Creatinine, Ser 0.95 0.76 - 1.27 mg/dL   GFR calc non Af Amer 100 >59 mL/min/1.73   GFR calc Af Amer 116 >59 mL/min/1.73   BUN/Creatinine Ratio 15 9 - 20   Sodium 139 134 - 144 mmol/L   Potassium 4.1 3.5 - 5.2 mmol/L   Chloride 101 96 - 106 mmol/L   CO2 22 20 - 29 mmol/L   Calcium 9.4 8.7 - 10.2 mg/dL  CBC with Differential/Platelet  Result Value Ref Range   WBC 7.6 3.4 - 10.8 x10E3/uL   RBC 5.13 4.14 - 5.80 x10E6/uL   Hemoglobin 15.5 13.0 - 17.7 g/dL   Hematocrit 44.6 37.5 - 51.0 %   MCV 87 79 - 97 fL   MCH 30.2 26.6 - 33.0 pg   MCHC 34.8 31.5 - 35.7 g/dL   RDW 14.2 12.3 - 15.4 %   Platelets 269 150 - 379 x10E3/uL   Neutrophils 62 Not Estab. %   Lymphs 22 Not Estab. %   Monocytes 11 Not Estab. %  Eos 4 Not Estab. %   Basos 1 Not Estab. %   Neutrophils Absolute 4.7 1.4 - 7.0 x10E3/uL   Lymphocytes Absolute 1.7 0.7 - 3.1 x10E3/uL   Monocytes Absolute 0.8 0.1 - 0.9 x10E3/uL   EOS (ABSOLUTE) 0.3 0.0 - 0.4 x10E3/uL   Basophils Absolute 0.1 0.0 - 0.2 x10E3/uL   Immature Granulocytes 0 Not Estab. %   Immature Grans (Abs) 0.0 0.0 - 0.1 x10E3/uL       Pertinent labs & imaging results that were available during my care of the patient were reviewed by me and considered in my medical decision making.  Assessment & Plan:  Tajuan was seen today for annual exam and numbness.  Diagnoses and all orders for this visit:  Encounter for general adult medical examination with abnormal findings Health maintenance discussed. Pt declined Tdap today. Labs pending. Diet and exercise encouraged.  -     HIV antibody  -     Lipid panel -     CMP14+EGFR -     CBC with Differential/Platelet -     Thyroid Panel With TSH -     Vitamin B12  Mixed hyperlipidemia Does not exercise on a regular basis. Does not watch diet. Pt aware to increase intake of fresh fruits and vegetables and lean proteins. Labs pending. Will initiate medications if necessary.  -     Lipid panel  BMI 30.0-30.9,adult Diet and exercise encouraged. Discussed limiting fast foods. Pt aware to avoid fried, greasy, and fatty foods. Exercise at least 5 times per week for at least 30 minutes.  -     CMP14+EGFR -     Thyroid Panel With TSH  Paresthesia of both feet Paresthesia of both hands Worse with inactivity and certain positions. Pt advised to change positions frequently. Will check B12 today. Negative Phalen's test. No deformities.  -     Vitamin B12     Continue all other maintenance medications.  Follow up plan: Return in 4 weeks (on 10/29/2018), or if symptoms worsen or fail to improve, for paresthesia.  Educational handout given for health maintenance.   The above assessment and management plan was discussed with the patient. The patient verbalized understanding of and has agreed to the management plan. Patient is aware to call the clinic if symptoms persist or worsen. Patient is aware when to return to the clinic for a follow-up visit. Patient educated on when it is appropriate to go to the emergency department.   Monia Pouch, FNP-C [facility] [phone]

## 2018-10-02 LAB — CMP14+EGFR
ALT: 26 IU/L (ref 0–44)
AST: 19 IU/L (ref 0–40)
Albumin/Globulin Ratio: 1.8 (ref 1.2–2.2)
Albumin: 4.5 g/dL (ref 4.0–5.0)
Alkaline Phosphatase: 111 IU/L (ref 39–117)
BUN/Creatinine Ratio: 16 (ref 9–20)
BUN: 15 mg/dL (ref 6–24)
Bilirubin Total: 0.5 mg/dL (ref 0.0–1.2)
CO2: 23 mmol/L (ref 20–29)
Calcium: 9.3 mg/dL (ref 8.7–10.2)
Chloride: 103 mmol/L (ref 96–106)
Creatinine, Ser: 0.94 mg/dL (ref 0.76–1.27)
GFR calc Af Amer: 117 mL/min/{1.73_m2} (ref >59)
GFR calc non Af Amer: 101 mL/min/{1.73_m2} (ref >59)
Globulin, Total: 2.5 g/dL (ref 1.5–4.5)
Glucose: 88 mg/dL (ref 65–99)
Potassium: 4.3 mmol/L (ref 3.5–5.2)
Sodium: 138 mmol/L (ref 134–144)
Total Protein: 7 g/dL (ref 6.0–8.5)

## 2018-10-02 LAB — LIPID PANEL
Chol/HDL Ratio: 5 ratio (ref 0.0–5.0)
Cholesterol, Total: 212 mg/dL — ABNORMAL HIGH (ref 100–199)
HDL: 42 mg/dL (ref >39)
LDL Calculated: 122 mg/dL — ABNORMAL HIGH (ref 0–99)
Triglycerides: 239 mg/dL — ABNORMAL HIGH (ref 0–149)
VLDL Cholesterol Cal: 48 mg/dL — ABNORMAL HIGH (ref 5–40)

## 2018-10-02 LAB — CBC WITH DIFFERENTIAL/PLATELET
Basophils Absolute: 0.1 10*3/uL (ref 0.0–0.2)
Basos: 1 %
EOS (ABSOLUTE): 0.1 10*3/uL (ref 0.0–0.4)
Eos: 2 %
Hematocrit: 44.8 % (ref 37.5–51.0)
Hemoglobin: 15.7 g/dL (ref 13.0–17.7)
Immature Grans (Abs): 0 10*3/uL (ref 0.0–0.1)
Immature Granulocytes: 1 %
Lymphocytes Absolute: 1.5 10*3/uL (ref 0.7–3.1)
Lymphs: 18 %
MCH: 30.7 pg (ref 26.6–33.0)
MCHC: 35 g/dL (ref 31.5–35.7)
MCV: 88 fL (ref 79–97)
Monocytes Absolute: 0.9 10*3/uL (ref 0.1–0.9)
Monocytes: 11 %
Neutrophils Absolute: 5.7 10*3/uL (ref 1.4–7.0)
Neutrophils: 67 %
Platelets: 298 10*3/uL (ref 150–450)
RBC: 5.11 x10E6/uL (ref 4.14–5.80)
RDW: 12.5 % (ref 11.6–15.4)
WBC: 8.4 10*3/uL (ref 3.4–10.8)

## 2018-10-02 LAB — THYROID PANEL WITH TSH
Free Thyroxine Index: 1.6 (ref 1.2–4.9)
T3 Uptake Ratio: 22 % — ABNORMAL LOW (ref 24–39)
T4, Total: 7.4 ug/dL (ref 4.5–12.0)
TSH: 2.02 u[IU]/mL (ref 0.450–4.500)

## 2018-10-02 LAB — VITAMIN B12: Vitamin B-12: 403 pg/mL (ref 232–1245)

## 2018-10-02 LAB — HIV ANTIBODY (ROUTINE TESTING W REFLEX): HIV Screen 4th Generation wRfx: NONREACTIVE

## 2020-01-02 ENCOUNTER — Telehealth: Payer: Self-pay | Admitting: *Deleted

## 2020-01-02 NOTE — Telephone Encounter (Signed)
ERROR - attached to wrong patient.

## 2020-01-02 NOTE — Telephone Encounter (Signed)
error 

## 2020-01-02 NOTE — Telephone Encounter (Signed)
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  His blood pressure has been elevated.  I do not know who his DOT examiner is but according to the DOT examiner's handbook there has to be evidence that patient has sustained blood pressures less than 140/90 with documented evidence.  His blood pressure was elevated at prior visit.  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  Thanks

## 2020-01-28 ENCOUNTER — Other Ambulatory Visit: Payer: Self-pay

## 2020-01-28 ENCOUNTER — Ambulatory Visit (INDEPENDENT_AMBULATORY_CARE_PROVIDER_SITE_OTHER): Payer: Managed Care, Other (non HMO) | Admitting: Family Medicine

## 2020-01-28 ENCOUNTER — Encounter: Payer: Self-pay | Admitting: Family Medicine

## 2020-01-28 VITALS — BP 129/89 | HR 80 | Temp 98.4°F | Ht 71.0 in | Wt 218.6 lb

## 2020-01-28 DIAGNOSIS — Z6830 Body mass index (BMI) 30.0-30.9, adult: Secondary | ICD-10-CM

## 2020-01-28 DIAGNOSIS — Z0001 Encounter for general adult medical examination with abnormal findings: Secondary | ICD-10-CM | POA: Diagnosis not present

## 2020-01-28 DIAGNOSIS — E782 Mixed hyperlipidemia: Secondary | ICD-10-CM

## 2020-01-28 DIAGNOSIS — Z0000 Encounter for general adult medical examination without abnormal findings: Secondary | ICD-10-CM

## 2020-01-28 LAB — URINALYSIS
Bilirubin, UA: NEGATIVE
Glucose, UA: NEGATIVE
Ketones, UA: NEGATIVE
Leukocytes,UA: NEGATIVE
Nitrite, UA: NEGATIVE
Protein,UA: NEGATIVE
RBC, UA: NEGATIVE
Specific Gravity, UA: 1.02 (ref 1.005–1.030)
Urobilinogen, Ur: 0.2 mg/dL (ref 0.2–1.0)
pH, UA: 6 (ref 5.0–7.5)

## 2020-01-28 LAB — CBC WITH DIFFERENTIAL/PLATELET

## 2020-01-28 NOTE — Progress Notes (Signed)
Subjective:  Patient ID: Andre Long, male    DOB: 07-02-1980  Age: 39 y.o. MRN: [id number]  CC: Annual Exam   HPI Andre Long presents for physical examination.  He reports occasional dizziness.  He says that this occurs for about 2 seconds to 45 seconds generally after eating breakfast.  Sometimes after eating food.  He states it is okay if he just leans against something and chills for a few seconds.  He does not drink water much.  Patient is married has 4 children 2 are actively involved in sports he likes to go to the games frequently.  He works at the [employer] [place] and has to leave his home at 5 AM to get to work by 6 AM.  He lives at [facility] and commutes 1 hour each way.  This leaves him with only about 5 to 6 hours of sleep per night particularly if he has to help kids with her homework.  He admits to eating a lot of fast foods such as [employer] and [employer].  He relates this back to his gym pack schedule related to trying to be a good dad as well as a good employee at his job.  He mentions that there was some recent job conflict over his need to add a kids function.  Depression screen Andre Long Main 2/9 01/28/2020 10/01/2018 06/27/2017  Decreased Interest 0 0 0  Down, Depressed, Hopeless 0 0 0  PHQ - 2 Score 0 0 0    History Andre Long has a past medical history of Decrease in appetite, RLQ abdominal pain, and URI (upper respiratory infection).   He has a past surgical history that includes Appendectomy; Hernia repair; and wrist sx.   His family history includes Diabetes in an other family member; Hyperlipidemia in his father.He reports that he quit smoking about 18 years ago. He has never used smokeless tobacco. He reports that he does not drink alcohol and does not use drugs.    ROS Review of Systems  Constitutional: Negative for activity change (Formerly regular exercise, now sedentary), fatigue and unexpected weight change.  HENT: Negative for  congestion, ear pain, hearing loss, postnasal drip and trouble swallowing.   Eyes: Negative for pain and visual disturbance.  Respiratory: Negative for cough, chest tightness and shortness of breath.   Cardiovascular: Negative for chest pain, palpitations and leg swelling.  Gastrointestinal: Negative for abdominal distention, abdominal pain, blood in stool, constipation, diarrhea, nausea and vomiting.  Endocrine: Negative for cold intolerance, heat intolerance and polydipsia.  Genitourinary: Negative for difficulty urinating, dysuria, flank pain, frequency and urgency.  Musculoskeletal: Negative for arthralgias and joint swelling.  Skin: Negative for color change, rash and wound.  Neurological: Positive for dizziness (See HPI). Negative for syncope, speech difficulty, weakness, light-headedness, numbness and headaches.  Hematological: Does not bruise/bleed easily.  Psychiatric/Behavioral: Negative for confusion, decreased concentration, dysphoric mood and sleep disturbance (He sleeps okay, just not long enough averaging 5 to 6 hours per night.). The patient is not nervous/anxious.     Objective:  BP 129/89   Pulse 80   Temp 98.4 F (36.9 C) (Temporal)   Ht '5\' 11"'  (1.803 m)   Wt 218 lb 9.6 oz (99.2 kg)   BMI 30.49 kg/m   BP Readings from Last 3 Encounters:  01/28/20 129/89  10/01/18 133/89  06/27/17 134/90    Wt Readings from Last 3 Encounters:  01/28/20 218 lb 9.6 oz (99.2 kg)  10/01/18 219 lb 6.4 oz (  99.5 kg)  06/27/17 201 lb 3.2 oz (91.3 kg)     Physical Exam Constitutional:      Appearance: He is well-developed.  HENT:     Head: Normocephalic and atraumatic.  Eyes:     Pupils: Pupils are equal, round, and reactive to light.  Neck:     Thyroid: No thyromegaly.     Trachea: No tracheal deviation.  Cardiovascular:     Rate and Rhythm: Normal rate and regular rhythm.     Heart sounds: Normal heart sounds. No murmur heard.  No friction rub. No gallop.   Pulmonary:      Breath sounds: Normal breath sounds. No wheezing or rales.  Abdominal:     General: Bowel sounds are normal. There is no distension.     Palpations: Abdomen is soft. There is no mass.     Tenderness: There is no abdominal tenderness.     Hernia: There is no hernia in the left inguinal area or right inguinal area.  Genitourinary:    Penis: Normal and circumcised.      Testes: Normal.        Right: Mass or tenderness not present.        Left: Mass or tenderness not present.  Musculoskeletal:        General: Normal range of motion.     Cervical back: Normal range of motion.  Lymphadenopathy:     Cervical: No cervical adenopathy.  Skin:    General: Skin is warm and dry.  Neurological:     Mental Status: He is alert and oriented to person, place, and time.       Assessment & Plan:   Jahkai was seen today for annual exam.  Diagnoses and all orders for this visit:  Well adult exam -     CBC with Differential/Platelet -     CMP14+EGFR -     Lipid panel -     Urinalysis -     VITAMIN D 25 Hydroxy (Vit-D Deficiency, Fractures)  Mixed hyperlipidemia -     CBC with Differential/Platelet -     CMP14+EGFR -     Lipid panel -     Urinalysis  BMI 30.0-30.9,adult -     CBC with Differential/Platelet -     CMP14+EGFR -     Lipid panel -     Urinalysis -     VITAMIN D 25 Hydroxy (Vit-D Deficiency, Fractures)       I have discontinued Quientin Peyton's cyclobenzaprine. I am also having him maintain his loratadine.  Allergies as of 01/28/2020      Reactions   Sulfa Antibiotics       Medication List       Accurate as of January 28, 2020 12:24 PM. If you have any questions, ask your nurse or doctor.        STOP taking these medications   cyclobenzaprine 10 MG tablet Commonly known as: FLEXERIL Stopped by: Claretta Fraise, MD     TAKE these medications   loratadine 10 MG tablet Commonly known as: CLARITIN Take 10 mg by mouth daily. OTC  [facility] comparable brand        We discussed numerous lifestyle interventions that would be important for them.  First he needs to increase sleep to 7-8 hours per night.  He needs to plan his mealtime so that his fast food consumption can be dramatically diminished.  Regular exercise was recommended.  He needs to lose some weight.  Eventually  he should be around 175 to 180 pounds.  Follow-up: No follow-ups on file.  Claretta Fraise, M.D.

## 2020-01-29 ENCOUNTER — Other Ambulatory Visit: Payer: Self-pay | Admitting: Family Medicine

## 2020-01-29 LAB — CMP14+EGFR
ALT: 24 IU/L (ref 0–44)
AST: 16 IU/L (ref 0–40)
Albumin/Globulin Ratio: 1.5 (ref 1.2–2.2)
Albumin: 4.3 g/dL (ref 4.0–5.0)
Alkaline Phosphatase: 130 IU/L — ABNORMAL HIGH (ref 44–121)
BUN/Creatinine Ratio: 14 (ref 9–20)
BUN: 14 mg/dL (ref 6–24)
Bilirubin Total: 0.6 mg/dL (ref 0.0–1.2)
CO2: 24 mmol/L (ref 20–29)
Calcium: 9 mg/dL (ref 8.7–10.2)
Chloride: 104 mmol/L (ref 96–106)
Creatinine, Ser: 0.99 mg/dL (ref 0.76–1.27)
GFR calc Af Amer: 109 mL/min/{1.73_m2} (ref >59)
GFR calc non Af Amer: 94 mL/min/{1.73_m2} (ref >59)
Globulin, Total: 2.8 g/dL (ref 1.5–4.5)
Glucose: 90 mg/dL (ref 65–99)
Potassium: 4.1 mmol/L (ref 3.5–5.2)
Sodium: 140 mmol/L (ref 134–144)
Total Protein: 7.1 g/dL (ref 6.0–8.5)

## 2020-01-29 LAB — CBC WITH DIFFERENTIAL/PLATELET
Basophils Absolute: 0.1 10*3/uL (ref 0.0–0.2)
Basos: 1 %
Eos: 4 %
Hematocrit: 45.6 % (ref 37.5–51.0)
Hemoglobin: 15.5 g/dL (ref 13.0–17.7)
Immature Granulocytes: 0 %
Lymphocytes Absolute: 1.3 10*3/uL (ref 0.7–3.1)
Lymphs: 17 %
MCH: 30.1 pg (ref 26.6–33.0)
MCV: 89 fL (ref 79–97)
Monocytes: 11 %
Neutrophils Absolute: 5 10*3/uL (ref 1.4–7.0)
Neutrophils: 67 %
Platelets: 282 10*3/uL (ref 150–450)
RBC: 5.15 x10E6/uL (ref 4.14–5.80)
RDW: 12.8 % (ref 11.6–15.4)

## 2020-01-29 LAB — LIPID PANEL
Chol/HDL Ratio: 5.7 ratio — ABNORMAL HIGH (ref 0.0–5.0)
Cholesterol, Total: 228 mg/dL — ABNORMAL HIGH (ref 100–199)
HDL: 40 mg/dL (ref >39)
LDL Chol Calc (NIH): 153 mg/dL — ABNORMAL HIGH (ref 0–99)
Triglycerides: 193 mg/dL — ABNORMAL HIGH (ref 0–149)
VLDL Cholesterol Cal: 35 mg/dL (ref 5–40)

## 2020-01-29 LAB — VITAMIN D 25 HYDROXY (VIT D DEFICIENCY, FRACTURES): Vit D, 25-Hydroxy: 19.4 ng/mL — ABNORMAL LOW (ref 30.0–100.0)

## 2020-01-29 MED ORDER — ROSUVASTATIN CALCIUM 10 MG PO TABS: 10.0000 mg | ORAL_TABLET | Freq: Every day | ORAL | 1 refills | Status: DC

## 2020-07-30 ENCOUNTER — Encounter: Payer: Self-pay | Admitting: Family Medicine

## 2020-07-30 ENCOUNTER — Ambulatory Visit: Payer: 59 | Admitting: Family Medicine

## 2020-07-30 DIAGNOSIS — J301 Allergic rhinitis due to pollen: Secondary | ICD-10-CM

## 2020-07-30 MED ORDER — PREDNISONE 20 MG PO TABS: 20.0000 mg | ORAL_TABLET | Freq: Every day | ORAL | 0 refills | Status: AC

## 2020-07-30 MED ORDER — FLUTICASONE PROPIONATE 50 MCG/ACT NA SUSP: 2.0000 | Freq: Every day | NASAL | 6 refills | Status: AC

## 2020-07-30 NOTE — Progress Notes (Signed)
   Virtual Visit  Note Due to COVID-19 pandemic this visit was conducted virtually. This visit type was conducted due to national recommendations for restrictions regarding the COVID-19 Pandemic (e.g. social distancing, sheltering in place) in an effort to limit this patient's exposure and mitigate transmission in our community. All issues noted in this document were discussed and addressed.  A physical exam was not performed with this format.  I connected with Francia Greaves on 07/30/20 at 1130 by telephone and verified that I am speaking with the correct person using two identifiers. Salbador Fiveash is currently located at home and no one is currently with him during the visit. The provider, Gabriel Earing, FNP is located in their office at time of visit.  I discussed the limitations, risks, security and privacy concerns of performing an evaluation and management service by telephone and the availability of in person appointments. I also discussed with the patient that there may be a patient responsible charge related to this service. The patient expressed understanding and agreed to proceed.  CC: sinusitis  History and Present Illness:  HPI  Miqueas reports nasal congestion, post nasal drip, and dry cough x 3 days. His throat also feels scratchy. He has a history of seasonal allergies in the spring. He has been taking an OTC allergy pill and nyquil. He denies HA, fever, body aches, chills, nausea, vomiting, chest pain, or shortness of breath.     ROS  As per HPI.    Observations/Objective: Alert and oriented x 3. Able to speak in full sentences without difficulty.    Assessment and Plan: Leeman was seen today for sinus problem.  Diagnoses and all orders for this visit:  Seasonal allergic rhinitis due to pollen Prednisone burst given. No indications for antbiotics currently. Continue allergy pill, add flonase. Mucinex for congestion. Return to office for new or worsening symptoms, or if  symptoms persist.  -     predniSONE (DELTASONE) 20 MG tablet; Take 1 tablet (20 mg total) by mouth daily with breakfast for 5 days. -     fluticasone (FLONASE) 50 MCG/ACT nasal spray; Place 2 sprays into both nostrils daily.     Follow Up Instructions: Return to office for new or worsening symptoms, or if symptoms persist.     I discussed the assessment and treatment plan with the patient. The patient was provided an opportunity to ask questions and all were answered. The patient agreed with the plan and demonstrated an understanding of the instructions.   The patient was advised to call back or seek an in-person evaluation if the symptoms worsen or if the condition fails to improve as anticipated.  The above assessment and management plan was discussed with the patient. The patient verbalized understanding of and has agreed to the management plan. Patient is aware to call the clinic if symptoms persist or worsen. Patient is aware when to return to the clinic for a follow-up visit. Patient educated on when it is appropriate to go to the emergency department.   Time call ended:  1142  I provided 12 minutes of  non face-to-face time during this encounter.    Gabriel Earing, FNP

## 2021-02-09 ENCOUNTER — Encounter: Payer: 59 | Admitting: Family Medicine

## 2021-02-16 ENCOUNTER — Encounter: Payer: Self-pay | Admitting: Family Medicine

## 2021-11-29 ENCOUNTER — Other Ambulatory Visit: Payer: Self-pay | Admitting: Family Medicine

## 2021-11-29 ENCOUNTER — Telehealth: Payer: Self-pay | Admitting: Family Medicine

## 2021-11-29 DIAGNOSIS — Z1159 Encounter for screening for other viral diseases: Secondary | ICD-10-CM

## 2021-11-29 DIAGNOSIS — E559 Vitamin D deficiency, unspecified: Secondary | ICD-10-CM

## 2021-11-29 DIAGNOSIS — Z0000 Encounter for general adult medical examination without abnormal findings: Secondary | ICD-10-CM

## 2021-11-29 DIAGNOSIS — E782 Mixed hyperlipidemia: Secondary | ICD-10-CM

## 2021-11-29 NOTE — Telephone Encounter (Signed)
Labs ordered.

## 2021-11-30 ENCOUNTER — Encounter: Payer: Self-pay | Admitting: Family Medicine

## 2021-11-30 ENCOUNTER — Other Ambulatory Visit: Payer: Self-pay | Admitting: *Deleted

## 2021-11-30 ENCOUNTER — Ambulatory Visit (INDEPENDENT_AMBULATORY_CARE_PROVIDER_SITE_OTHER): Payer: 59 | Admitting: Family Medicine

## 2021-11-30 VITALS — BP 135/73 | HR 77 | Temp 98.4°F | Ht 71.0 in | Wt 217.6 lb

## 2021-11-30 DIAGNOSIS — R5383 Other fatigue: Secondary | ICD-10-CM | POA: Diagnosis not present

## 2021-11-30 DIAGNOSIS — E782 Mixed hyperlipidemia: Secondary | ICD-10-CM | POA: Diagnosis not present

## 2021-11-30 DIAGNOSIS — Z0001 Encounter for general adult medical examination with abnormal findings: Secondary | ICD-10-CM

## 2021-11-30 DIAGNOSIS — Z6830 Body mass index (BMI) 30.0-30.9, adult: Secondary | ICD-10-CM

## 2021-11-30 DIAGNOSIS — Z0000 Encounter for general adult medical examination without abnormal findings: Secondary | ICD-10-CM

## 2021-11-30 LAB — URINALYSIS
Bilirubin, UA: NEGATIVE
Glucose, UA: NEGATIVE
Ketones, UA: NEGATIVE
Leukocytes,UA: NEGATIVE
Nitrite, UA: NEGATIVE
RBC, UA: NEGATIVE
Specific Gravity, UA: >1.03 — ABNORMAL HIGH (ref 1.005–1.030)
Urobilinogen, Ur: 0.2 mg/dL (ref 0.2–1.0)
pH, UA: 6 (ref 5.0–7.5)

## 2021-11-30 NOTE — Progress Notes (Signed)
Subjective:  Patient ID: Andre Long, male    DOB: [birth year]  Age: 41 y.o. MRN: [id number]  CC: Annual Exam   HPI Mansur Patti presents for Annual Physical. Tired a lot.   Gets sleepy when he sits still.      11/30/2021    2:16 PM 01/28/2020    9:19 AM 10/01/2018    2:28 PM  Depression screen PHQ 2/9  Decreased Interest 0 0 0  Down, Depressed, Hopeless 0 0 0  PHQ - 2 Score 0 0 0    History Keyon has a past medical history of Decrease in appetite, RLQ abdominal pain, and URI (upper respiratory infection).   He has a past surgical history that includes Appendectomy; Hernia repair; and wrist sx.   His family history includes Diabetes in an other family member; Heart disease in his maternal grandfather and maternal grandmother; Hyperlipidemia in his father; Miscarriages / [place] in an other family member.He reports that he quit smoking about 20 years ago. His smoking use included cigarettes. He has never used smokeless tobacco. He reports that he does not drink alcohol and does not use drugs.    ROS Review of Systems  Constitutional:  Positive for fatigue. Negative for activity change and unexpected weight change.  HENT:  Negative for congestion, ear pain, hearing loss, postnasal drip and trouble swallowing.   Eyes:  Negative for pain and visual disturbance.  Respiratory:  Negative for cough, chest tightness and shortness of breath.   Cardiovascular:  Negative for chest pain, palpitations and leg swelling.  Gastrointestinal:  Negative for abdominal distention, abdominal pain, blood in stool, constipation, diarrhea, nausea and vomiting.  Endocrine: Negative for cold intolerance, heat intolerance and polydipsia.  Genitourinary:  Negative for difficulty urinating, dysuria, flank pain, frequency and urgency.  Musculoskeletal:  Negative for arthralgias and joint swelling.  Skin:  Negative for color change, rash and wound.  Neurological:  Negative for dizziness, syncope, speech  difficulty, weakness, light-headedness, numbness and headaches.  Hematological:  Does not bruise/bleed easily.  Psychiatric/Behavioral:  Negative for confusion, decreased concentration, dysphoric mood and sleep disturbance. The patient is not nervous/anxious.     Objective:  BP 135/73   Pulse 77   Temp 98.4 F (36.9 C)   Ht 5\' 11"  (1.803 m)   Wt 217 lb 9.6 oz (98.7 kg)   SpO2 94%   BMI 30.35 kg/m   BP Readings from Last 3 Encounters:  11/30/21 135/73  01/28/20 129/89  10/01/18 133/89    Wt Readings from Last 3 Encounters:  11/30/21 217 lb 9.6 oz (98.7 kg)  01/28/20 218 lb 9.6 oz (99.2 kg)  10/01/18 219 lb 6.4 oz (99.5 kg)     Physical Exam Constitutional:      Appearance: He is well-developed. He is obese.  HENT:     Head: Normocephalic and atraumatic.  Eyes:     Pupils: Pupils are equal, round, and reactive to light.  Neck:     Thyroid: No thyromegaly.     Trachea: No tracheal deviation.  Cardiovascular:     Rate and Rhythm: Normal rate and regular rhythm.     Heart sounds: Normal heart sounds. No murmur heard.    No friction rub. No gallop.  Pulmonary:     Breath sounds: Normal breath sounds. No wheezing or rales.  Abdominal:     General: Bowel sounds are normal. There is no distension.     Palpations: Abdomen is soft. There is no mass.  Tenderness: There is no abdominal tenderness.     Hernia: There is no hernia in the left inguinal area.  Genitourinary:    Penis: Normal.      Testes: Normal.  Musculoskeletal:        General: Normal range of motion.     Cervical back: Normal range of motion.  Lymphadenopathy:     Cervical: No cervical adenopathy.  Skin:    General: Skin is warm and dry.  Neurological:     Mental Status: He is alert and oriented to person, place, and time.       Assessment & Plan:   Khyran was seen today for annual exam.  Diagnoses and all orders for this visit:  Fatigue, unspecified type -     TSH -     Testosterone,Free  and Total  Well adult exam -     Urinalysis  BMI 30.0-30.9,adult  Mixed hyperlipidemia       I have discontinued Laurence Sacra's rosuvastatin. I am also having him maintain his loratadine and fluticasone.  Allergies as of 11/30/2021       Reactions   Sulfa Antibiotics         Medication List        Accurate as of November 30, 2021  3:12 PM. If you have any questions, ask your nurse or doctor.          STOP taking these medications    rosuvastatin 10 MG tablet Commonly known as: Crestor Stopped by: Mechele Claude, MD       TAKE these medications    fluticasone 50 MCG/ACT nasal spray Commonly known as: FLONASE Place 2 sprays into both nostrils daily.   loratadine 10 MG tablet Commonly known as: CLARITIN Take 10 mg by mouth daily. OTC  [facility] comparable brand       We discussed lifestyle causes of fatigue including lack of sleep.  He is getting about 6 hours a night.  We also talked about conditioning and exercise.  He has put on a great deal of weight since he turned 38.  He is bordering on stage I obesity as well.  He is going to work on exercise and diet as well as sleep and see if that does not help with the fatigue.  We will also deal with any metabolic causes based on the lab results  Follow-up: Return in about 1 year (around 12/01/2022).  Mechele Claude, M.D.

## 2021-12-01 ENCOUNTER — Other Ambulatory Visit: Payer: Self-pay | Admitting: *Deleted

## 2021-12-01 LAB — CBC WITH DIFFERENTIAL/PLATELET
Basophils Absolute: 0.1 10*3/uL (ref 0.0–0.2)
Basos: 2 %
EOS (ABSOLUTE): 0.2 10*3/uL (ref 0.0–0.4)
Eos: 3 %
Hematocrit: 47.7 % (ref 37.5–51.0)
Hemoglobin: 16.3 g/dL (ref 13.0–17.7)
Immature Grans (Abs): 0 10*3/uL (ref 0.0–0.1)
Immature Granulocytes: 0 %
Lymphocytes Absolute: 1.4 10*3/uL (ref 0.7–3.1)
Lymphs: 21 %
MCH: 29.7 pg (ref 26.6–33.0)
MCHC: 34.2 g/dL (ref 31.5–35.7)
MCV: 87 fL (ref 79–97)
Monocytes Absolute: 0.8 10*3/uL (ref 0.1–0.9)
Monocytes: 12 %
Neutrophils Absolute: 4.3 10*3/uL (ref 1.4–7.0)
Neutrophils: 62 %
Platelets: 265 10*3/uL (ref 150–450)
RBC: 5.49 x10E6/uL (ref 4.14–5.80)
RDW: 12.7 % (ref 11.6–15.4)
WBC: 6.8 10*3/uL (ref 3.4–10.8)

## 2021-12-01 LAB — VITAMIN D 25 HYDROXY (VIT D DEFICIENCY, FRACTURES): Vit D, 25-Hydroxy: 20.4 ng/mL — ABNORMAL LOW (ref 30.0–100.0)

## 2021-12-01 LAB — CMP14+EGFR
ALT: 25 IU/L (ref 0–44)
AST: 15 IU/L (ref 0–40)
Albumin/Globulin Ratio: 1.6 (ref 1.2–2.2)
Albumin: 4.4 g/dL (ref 4.1–5.1)
Alkaline Phosphatase: 121 IU/L (ref 44–121)
BUN/Creatinine Ratio: 16 (ref 9–20)
BUN: 17 mg/dL (ref 6–24)
Bilirubin Total: 0.6 mg/dL (ref 0.0–1.2)
CO2: 21 mmol/L (ref 20–29)
Calcium: 9.1 mg/dL (ref 8.7–10.2)
Chloride: 104 mmol/L (ref 96–106)
Creatinine, Ser: 1.05 mg/dL (ref 0.76–1.27)
Globulin, Total: 2.7 g/dL (ref 1.5–4.5)
Glucose: 87 mg/dL (ref 70–99)
Potassium: 4.2 mmol/L (ref 3.5–5.2)
Sodium: 140 mmol/L (ref 134–144)
Total Protein: 7.1 g/dL (ref 6.0–8.5)
eGFR: 90 mL/min/{1.73_m2} (ref >59)

## 2021-12-01 LAB — LIPID PANEL
Chol/HDL Ratio: 5.3 ratio — ABNORMAL HIGH (ref 0.0–5.0)
Cholesterol, Total: 223 mg/dL — ABNORMAL HIGH (ref 100–199)
HDL: 42 mg/dL (ref >39)
LDL Chol Calc (NIH): 146 mg/dL — ABNORMAL HIGH (ref 0–99)
Triglycerides: 195 mg/dL — ABNORMAL HIGH (ref 0–149)
VLDL Cholesterol Cal: 35 mg/dL (ref 5–40)

## 2021-12-01 LAB — HEPATITIS C ANTIBODY: Hep C Virus Ab: NONREACTIVE

## 2021-12-01 MED ORDER — VITAMIN D (ERGOCALCIFEROL) 1.25 MG (50000 UNIT) PO CAPS: 50000.0000 [IU] | ORAL_CAPSULE | ORAL | 3 refills | Status: AC

## 2022-12-05 ENCOUNTER — Encounter: Payer: Self-pay | Admitting: Family Medicine

## 2022-12-05 ENCOUNTER — Encounter: Payer: 59 | Admitting: Family Medicine

## 2023-11-23 ENCOUNTER — Ambulatory Visit: Payer: Self-pay | Admitting: *Deleted

## 2023-11-23 ENCOUNTER — Encounter: Payer: Self-pay | Admitting: Family Medicine

## 2023-11-23 ENCOUNTER — Ambulatory Visit (INDEPENDENT_AMBULATORY_CARE_PROVIDER_SITE_OTHER): Admitting: Family Medicine

## 2023-11-23 VITALS — BP 141/91 | HR 82 | Temp 98.2°F | Ht 71.0 in | Wt 205.4 lb

## 2023-11-23 DIAGNOSIS — R600 Localized edema: Secondary | ICD-10-CM | POA: Diagnosis not present

## 2023-11-23 DIAGNOSIS — J0100 Acute maxillary sinusitis, unspecified: Secondary | ICD-10-CM

## 2023-11-23 MED ORDER — PREDNISONE 10 MG PO TABS: ORAL_TABLET | ORAL | 0 refills | Status: AC

## 2023-11-23 MED ORDER — AMOXICILLIN-POT CLAVULANATE 875-125 MG PO TABS
1.0000 | ORAL_TABLET | Freq: Two times a day (BID) | ORAL | 0 refills | Status: AC
Start: 2023-11-23 — End: ?

## 2023-11-23 NOTE — Progress Notes (Signed)
 Subjective:  Patient ID: Andre Long, male    DOB: 04-05-1981  Age: 43 y.o. MRN: [id number]  CC: Facial Swelling (Right side)   HPI Andre Long presents for unexplained swelling on the right side of his face.  It is not numb it is not painful but started swelling up yesterday.  It has not affected his vision.  It is hard for him to smile or chew on the right side of his mouth.     11/23/2023   10:03 AM 11/30/2021    2:16 PM 01/28/2020    9:19 AM  Depression screen PHQ 2/9  Decreased Interest 0 0 0  Down, Depressed, Hopeless 0 0 0  PHQ - 2 Score 0 0 0    History Andre Long has a past medical history of Decrease in appetite, RLQ abdominal pain, and URI (upper respiratory infection).   He has a past surgical history that includes Appendectomy; Hernia repair; and wrist sx.   His family history includes Diabetes in an other family member; Heart disease in his maternal grandfather and maternal grandmother; Hyperlipidemia in his father; Miscarriages / [place] in an other family member.He reports that he quit smoking about 22 years ago. His smoking use included cigarettes. He has never used smokeless tobacco. He reports that he does not drink alcohol and does not use drugs.    ROS Review of Systems  Constitutional:  Negative for fever.  Eyes:  Negative for photophobia, pain, redness and visual disturbance.  Respiratory:  Negative for shortness of breath.   Cardiovascular:  Negative for chest pain.  Musculoskeletal:  Negative for arthralgias.  Skin:  Negative for rash.  Neurological:  Negative for speech difficulty and weakness.    Objective:  BP (!) 141/91   Pulse 82   Temp 98.2 F (36.8 C)   Ht 5' 11 (1.803 m)   Wt 205 lb 6.4 oz (93.2 kg)   SpO2 97%   BMI 28.65 kg/m   BP Readings from Last 3 Encounters:  11/23/23 (!) 141/91  11/30/21 135/73  01/28/20 129/89    Wt Readings from Last 3 Encounters:  11/23/23 205 lb 6.4 oz (93.2 kg)  11/30/21 217 lb 9.6 oz (98.7 kg)   01/28/20 218 lb 9.6 oz (99.2 kg)     Physical Exam Vitals reviewed.  Constitutional:      Appearance: He is well-developed.  HENT:     Head: Normocephalic and atraumatic.     Comments: The right cheek and jawline has moderate edema and the loss of the nasolabial fold.  Patient is able to speak clearly however when he smiles the right side of his mouth does not go up completely.  He is able to close his eye tightly and he cannot be pried open with gentle pressure.  There is no rash.  TMs are clear.    Right Ear: External ear normal.     Left Ear: External ear normal.     Nose: Congestion (With moderate erythema of the turbinates.) present.     Mouth/Throat:     Pharynx: Oropharyngeal exudate and posterior oropharyngeal erythema present.  Eyes:     Pupils: Pupils are equal, round, and reactive to light.  Cardiovascular:     Rate and Rhythm: Normal rate and regular rhythm.     Heart sounds: No murmur heard. Pulmonary:     Effort: No respiratory distress.     Breath sounds: Normal breath sounds.  Musculoskeletal:     Cervical back: Normal range of motion  and neck supple.  Neurological:     Mental Status: He is alert and oriented to person, place, and time.      Assessment & Plan:  Facial edema  Acute maxillary sinusitis, recurrence not specified  Other orders -     Amoxicillin -Pot Clavulanate; Take 1 tablet by mouth 2 (two) times daily. Take all of this medication  Dispense: 20 tablet; Refill: 0 -     predniSONE ; Take 5 daily for 2 days followed by 4,3,2 and 1 for 2 days each.  Dispense: 30 tablet; Refill: 0  This has needed the characteristics of Bell's palsy and that there is swelling but no paresis other than the inability to lift the right side of the mouth/lips.  And it does not appear to resemble stroke and that strength is intact other than orbicularis oris   Follow-up: Return in about 1 month (around 12/24/2023), or if symptoms worsen or fail to improve, for [facility]  physical.  Butler Der, M.D.

## 2023-11-23 NOTE — Telephone Encounter (Signed)
 FYI Only or Action Required?: FYI only for provider.  Patient was last seen in primary care on 11/30/2021 by Zollie Lowers, MD.  Called Nurse Triage reporting Facial Swelling.  Symptoms began yesterday.  Interventions attempted: Nothing.  Symptoms are: gradually worsening.  Triage Disposition: See Physician Within 24 Hours  Patient/caregiver understands and will follow disposition?: Yes    Reason for Disposition  Face swelling is painful to touch  Answer Assessment - Initial Assessment Questions 1. ONSET: When did the swelling start? (e.g., minutes, hours, days)     2 days- started yesterday am 2. LOCATION: What part of the face is swollen? (e.g., cheek, entire face, jaw joint area, under jaw)     R side of nostril- bottom of eye 3. SEVERITY: How swollen is it?     Visible swelling- R upper area of face- jaw has swelling 4. ITCHING: Is there any itching? If Yes, ask: How much?   (Scale 1-10; mild, moderate or severe)     no 5. PAIN: Is the swelling painful to touch? If Yes, ask: How painful is it?   (Scale 0-10; mild, moderate or severe)     3-4/10 6. FEVER: Do you have a fever? If Yes, ask: What is it, how was it measured, and when did it start?      no 7. CAUSE: What do you think is causing the face swelling?     unsure 8. NEW MEDICINES: Have there been any new medicines started recently?     no 9. RECURRENT SYMPTOM: Have you had face swelling before? If Yes, ask: When was the last time? What happened that time?     no 10. OTHER SYMPTOMS: Do you have any other symptoms? (e.g., leg swelling, toothache)       No other symptoms  Protocols used: Face Swelling-A-AH    Copied from CRM [id number]. Topic: Clinical - Red Word Triage >> Nov 23, 2023  8:16 AM Carlatta H wrote: [facility] that prompted transfer to Nurse Triage: Patient has swelling on right side of face for about 2 days//

## 2023-11-23 NOTE — Telephone Encounter (Signed)
 Apt scheduled.

## 2023-11-26 ENCOUNTER — Encounter: Payer: Self-pay | Admitting: Family Medicine
